# Patient Record
Sex: Male | Born: 1970 | Race: Black or African American | Hispanic: No | Marital: Single | Smoking: Current every day smoker
Health system: Southern US, Community
[De-identification: ages and names within clinical notes are randomized; demographics above are authoritative.]

---

## 2010-07-14 ENCOUNTER — Emergency Department (HOSPITAL_BASED_OUTPATIENT_CLINIC_OR_DEPARTMENT_OTHER)
Admission: EM | Admit: 2010-07-14 | Discharge: 2010-07-14 | Disposition: A | Discharge status: Home / Self Care | Payer: Self-pay | Attending: Emergency Medicine | Admitting: Emergency Medicine

## 2010-07-14 DIAGNOSIS — R05 Cough: Secondary | ICD-10-CM | POA: Insufficient documentation

## 2010-07-14 DIAGNOSIS — B9789 Other viral agents as the cause of diseases classified elsewhere: Secondary | ICD-10-CM | POA: Insufficient documentation

## 2010-07-14 DIAGNOSIS — R070 Pain in throat: Secondary | ICD-10-CM | POA: Insufficient documentation

## 2010-07-14 DIAGNOSIS — IMO0001 Reserved for inherently not codable concepts without codable children: Secondary | ICD-10-CM | POA: Insufficient documentation

## 2010-07-14 DIAGNOSIS — F17200 Nicotine dependence, unspecified, uncomplicated: Secondary | ICD-10-CM | POA: Insufficient documentation

## 2010-07-14 DIAGNOSIS — R059 Cough, unspecified: Secondary | ICD-10-CM | POA: Insufficient documentation

## 2015-04-09 ENCOUNTER — Emergency Department (HOSPITAL_COMMUNITY)
Admission: EM | Admit: 2015-04-09 | Discharge: 2015-04-09 | Disposition: A | Discharge status: Home / Self Care | Payer: Self-pay | Attending: Emergency Medicine | Admitting: Emergency Medicine

## 2015-04-09 ENCOUNTER — Encounter (HOSPITAL_COMMUNITY): Payer: Self-pay

## 2015-04-09 DIAGNOSIS — K029 Dental caries, unspecified: Secondary | ICD-10-CM | POA: Insufficient documentation

## 2015-04-09 DIAGNOSIS — F17200 Nicotine dependence, unspecified, uncomplicated: Secondary | ICD-10-CM | POA: Insufficient documentation

## 2015-04-09 DIAGNOSIS — K0889 Other specified disorders of teeth and supporting structures: Secondary | ICD-10-CM | POA: Insufficient documentation

## 2015-04-09 DIAGNOSIS — H6123 Impacted cerumen, bilateral: Secondary | ICD-10-CM | POA: Insufficient documentation

## 2015-04-09 MED ORDER — NAPROXEN 500 MG PO TABS: 500.0000 mg | ORAL_TABLET | Freq: Two times a day (BID) | ORAL | Status: DC

## 2015-04-09 MED ORDER — AMOXICILLIN 500 MG PO CAPS: 500.0000 mg | ORAL_CAPSULE | Freq: Three times a day (TID) | ORAL | Status: DC

## 2015-04-09 MED ORDER — NEOMYCIN-COLIST-HC-THONZONIUM 3.3-3-10-0.5 MG/ML OT SUSP
4.0000 [drp] | Freq: Four times a day (QID) | OTIC | Status: DC
  Filled 2015-04-09: qty 5

## 2015-04-09 MED ORDER — HYDROCODONE-ACETAMINOPHEN 5-325 MG PO TABS
1.0000 | ORAL_TABLET | Freq: Once | ORAL | Status: AC
  Administered 2015-04-09: 1 via ORAL
  Filled 2015-04-09: qty 1

## 2015-04-09 MED ORDER — NEOMYCIN-POLYMYXIN-HC 3.5-10000-1 OT SUSP
4.0000 [drp] | Freq: Four times a day (QID) | OTIC | Status: DC
  Administered 2015-04-09: 4 [drp] via OTIC
  Filled 2015-04-09: qty 10

## 2015-04-09 NOTE — ED Notes (Signed)
Pt states "I am unable to hear." Has been going on for 2 months. Hurts when he lefts of his left side.

## 2015-04-09 NOTE — ED Provider Notes (Signed)
CSN: [id number]     Arrival date & time 04/09/15  1825 History  By signing my name below, I, Dillon Schaefer, attest that this documentation has been prepared under the direction and in the presence of Dillon BuffaloHope Sharmane Dame, NP. Electronically Signed: Evon Slackerrance Schaefer, ED Scribe. 04/09/2015. 8:32 PM.     Chief Complaint  Patient presents with  . Otalgia  . Hearing Problem    Patient is a 44 y.o. male presenting with ear pain. The history is provided by the patient. No language interpreter was used.  Otalgia Location:  Bilateral Behind ear:  No abnormality Severity:  Moderate Onset quality:  Gradual Duration:  2 months Timing:  Constant Chronicity:  New Relieved by:  None tried Worsened by:  Nothing tried Ineffective treatments:  None tried Associated symptoms: hearing loss   Associated symptoms: no ear discharge    HPI Comments: Dillon Schaefer is a 44 y.o. male who presents to the Emergency Department complaining of ear pain onset 2 months. Pt states that he has associated decreased hearing. Pt states that he his ears feel clogged. Pt also report left lower tooth pain.   History reviewed. No pertinent past medical history. History reviewed. No pertinent past surgical history. History reviewed. No pertinent family history. Social History  Substance Use Topics  . Smoking status: Current Every Day Smoker -- 0.50 packs/day  . Smokeless tobacco: None  . Alcohol Use: Yes    Review of Systems  HENT: Positive for dental problem, ear pain and hearing loss. Negative for ear discharge.   All other systems reviewed and are negative.    Allergies  Review of patient's allergies indicates not on file.  Home Medications   Prior to Admission medications   Medication Sig Start Date End Date Taking? Authorizing Provider  amoxicillin (AMOXIL) 500 MG capsule Take 1 capsule (500 mg total) by mouth 3 (three) times daily. 04/09/15   Dillon Wilbert Orlene OchM [place] Cordts, NP  naproxen (NAPROSYN) 500 MG tablet Take 1 tablet  (500 mg total) by mouth 2 (two) times daily. 04/09/15   Dillon Coste Orlene OchM Lurine Imel, NP   BP 124/83 mmHg  Pulse 107  Temp(Src) 98.2 F (36.8 C) (Oral)  Resp 20  SpO2 100%   Physical Exam  Constitutional: He is oriented to person, place, and time. He appears well-developed and well-nourished. No distress.  HENT:  Head: Normocephalic and atraumatic.  1st and 2nd molar decayed to gum line with swelling and erythema around tooth. TM's occluded by cerumen  Eyes: Conjunctivae and EOM are normal.  Neck: Neck supple. No tracheal deviation present.  Cardiovascular: Normal rate, regular rhythm and normal heart sounds.   Pulmonary/Chest: Effort normal and breath sounds normal. No respiratory distress.  Musculoskeletal: Normal range of motion.  Neurological: He is alert and oriented to person, place, and time.  Skin: Skin is warm and dry.  Psychiatric: He has a normal mood and affect. His behavior is normal.  Nursing note and vitals reviewed.   ED Course  Procedures (including critical care time)  Ear irrigation with good results, large pieces of cerumen removed with irrigation DIAGNOSTIC STUDIES: Oxygen Saturation is 100% on RA, normal by my interpretation.    COORDINATION OF CARE: 7:26 PM-Discussed treatment plan with pt at bedside and pt agreed to plan.      MDM  44 y.o. male with decreased hearing and pressure feeling in ear x 2 weeks and dental pain for the past few days. Stable for d/c without fever and does not appear toxic. Cortisporin Otic  suspension with first dose instilled into both ear canals prior to d/c. Will treat with Amoxicillin for dental infection and NSAIDS for pain. Dental referral given.   Final diagnoses:  Cerumen impaction, bilateral  Pain due to dental caries    I personally performed the services described in this documentation, which was scribed in my presence. The recorded information has been reviewed and is accurate.      [place], [place] 04/09/15 2033  Dillon Guise, MD 04/10/15 Dillon Schaefer

## 2015-04-09 NOTE — Discharge Instructions (Signed)
Call the dentis tomorrow for a follow up appointment.  Use the ear drops 4 drops in each ear 3 or 4 times a day for the next week.  Dental Caries Dental caries is tooth decay. This decay can cause a hole in teeth (cavity) that can get bigger and deeper over time. HOME CARE  Brush and floss your teeth. Do this at least two times a day.  Use a fluoride toothpaste.  Use a mouth rinse if told by your dentist or doctor.  Eat less sugary and starchy foods. Drink less sugary drinks.  Avoid snacking often on sugary and starchy foods. Avoid sipping often on sugary drinks.  Keep regular checkups and cleanings with your dentist.  Use fluoride supplements if told by your dentist or doctor.  Allow fluoride to be applied to teeth if told by your dentist or doctor.   This information is not intended to replace advice given to you by your health care provider. Make sure you discuss any questions you have with your health care provider.   Document Released: 02/06/2008 Document Revised: 05/20/2014 Document Reviewed: 05/01/2012 Elsevier Interactive Patient Education [employer]2016 [employer] Inc.

## 2018-07-22 ENCOUNTER — Emergency Department (HOSPITAL_COMMUNITY): Payer: Self-pay

## 2018-07-22 ENCOUNTER — Other Ambulatory Visit: Payer: Self-pay

## 2018-07-22 ENCOUNTER — Inpatient Hospital Stay (HOSPITAL_COMMUNITY)
Admission: EM | Admit: 2018-07-22 | Discharge: 2018-07-28 | DRG: 377 | Disposition: A | Discharge status: Home / Self Care | Payer: Self-pay | Attending: Internal Medicine | Admitting: Internal Medicine

## 2018-07-22 ENCOUNTER — Encounter (HOSPITAL_COMMUNITY): Payer: Self-pay

## 2018-07-22 DIAGNOSIS — Z79899 Other long term (current) drug therapy: Secondary | ICD-10-CM

## 2018-07-22 DIAGNOSIS — R9389 Abnormal findings on diagnostic imaging of other specified body structures: Secondary | ICD-10-CM | POA: Diagnosis present

## 2018-07-22 DIAGNOSIS — E872 Acidosis: Secondary | ICD-10-CM | POA: Diagnosis present

## 2018-07-22 DIAGNOSIS — M7989 Other specified soft tissue disorders: Secondary | ICD-10-CM | POA: Diagnosis present

## 2018-07-22 DIAGNOSIS — M154 Erosive (osteo)arthritis: Secondary | ICD-10-CM | POA: Diagnosis present

## 2018-07-22 DIAGNOSIS — Z23 Encounter for immunization: Secondary | ICD-10-CM

## 2018-07-22 DIAGNOSIS — F17210 Nicotine dependence, cigarettes, uncomplicated: Secondary | ICD-10-CM | POA: Diagnosis present

## 2018-07-22 DIAGNOSIS — Z791 Long term (current) use of non-steroidal anti-inflammatories (NSAID): Secondary | ICD-10-CM

## 2018-07-22 DIAGNOSIS — F1010 Alcohol abuse, uncomplicated: Secondary | ICD-10-CM | POA: Diagnosis present

## 2018-07-22 DIAGNOSIS — K5909 Other constipation: Secondary | ICD-10-CM | POA: Diagnosis present

## 2018-07-22 DIAGNOSIS — L03116 Cellulitis of left lower limb: Secondary | ICD-10-CM | POA: Diagnosis present

## 2018-07-22 DIAGNOSIS — M50322 Other cervical disc degeneration at C5-C6 level: Secondary | ICD-10-CM | POA: Diagnosis present

## 2018-07-22 DIAGNOSIS — Z9181 History of falling: Secondary | ICD-10-CM

## 2018-07-22 DIAGNOSIS — D62 Acute posthemorrhagic anemia: Secondary | ICD-10-CM | POA: Diagnosis present

## 2018-07-22 DIAGNOSIS — E440 Moderate protein-calorie malnutrition: Secondary | ICD-10-CM

## 2018-07-22 DIAGNOSIS — R634 Abnormal weight loss: Secondary | ICD-10-CM | POA: Diagnosis present

## 2018-07-22 DIAGNOSIS — J439 Emphysema, unspecified: Secondary | ICD-10-CM | POA: Diagnosis present

## 2018-07-22 DIAGNOSIS — E871 Hypo-osmolality and hyponatremia: Secondary | ICD-10-CM | POA: Diagnosis present

## 2018-07-22 DIAGNOSIS — K259 Gastric ulcer, unspecified as acute or chronic, without hemorrhage or perforation: Secondary | ICD-10-CM

## 2018-07-22 DIAGNOSIS — K760 Fatty (change of) liver, not elsewhere classified: Secondary | ICD-10-CM | POA: Diagnosis present

## 2018-07-22 DIAGNOSIS — K2100 Gastro-esophageal reflux disease with esophagitis, without bleeding: Secondary | ICD-10-CM

## 2018-07-22 DIAGNOSIS — K2970 Gastritis, unspecified, without bleeding: Secondary | ICD-10-CM

## 2018-07-22 DIAGNOSIS — F5089 Other specified eating disorder: Secondary | ICD-10-CM | POA: Diagnosis present

## 2018-07-22 DIAGNOSIS — F1290 Cannabis use, unspecified, uncomplicated: Secondary | ICD-10-CM | POA: Diagnosis present

## 2018-07-22 DIAGNOSIS — W19XXXA Unspecified fall, initial encounter: Secondary | ICD-10-CM

## 2018-07-22 DIAGNOSIS — K3189 Other diseases of stomach and duodenum: Secondary | ICD-10-CM | POA: Diagnosis present

## 2018-07-22 DIAGNOSIS — K2981 Duodenitis with bleeding: Principal | ICD-10-CM | POA: Diagnosis present

## 2018-07-22 DIAGNOSIS — D509 Iron deficiency anemia, unspecified: Secondary | ICD-10-CM

## 2018-07-22 DIAGNOSIS — K921 Melena: Secondary | ICD-10-CM | POA: Diagnosis present

## 2018-07-22 DIAGNOSIS — D649 Anemia, unspecified: Secondary | ICD-10-CM | POA: Diagnosis present

## 2018-07-22 DIAGNOSIS — R6510 Systemic inflammatory response syndrome (SIRS) of non-infectious origin without acute organ dysfunction: Secondary | ICD-10-CM | POA: Diagnosis present

## 2018-07-22 DIAGNOSIS — K2990 Gastroduodenitis, unspecified, without bleeding: Secondary | ICD-10-CM | POA: Diagnosis present

## 2018-07-22 DIAGNOSIS — Z833 Family history of diabetes mellitus: Secondary | ICD-10-CM

## 2018-07-22 DIAGNOSIS — M109 Gout, unspecified: Secondary | ICD-10-CM | POA: Diagnosis present

## 2018-07-22 DIAGNOSIS — E43 Unspecified severe protein-calorie malnutrition: Secondary | ICD-10-CM | POA: Diagnosis present

## 2018-07-22 DIAGNOSIS — R61 Generalized hyperhidrosis: Secondary | ICD-10-CM | POA: Diagnosis present

## 2018-07-22 DIAGNOSIS — Y92009 Unspecified place in unspecified non-institutional (private) residence as the place of occurrence of the external cause: Secondary | ICD-10-CM

## 2018-07-22 DIAGNOSIS — M009 Pyogenic arthritis, unspecified: Secondary | ICD-10-CM | POA: Diagnosis present

## 2018-07-22 DIAGNOSIS — E860 Dehydration: Secondary | ICD-10-CM | POA: Diagnosis present

## 2018-07-22 DIAGNOSIS — M791 Myalgia, unspecified site: Secondary | ICD-10-CM

## 2018-07-22 DIAGNOSIS — K254 Chronic or unspecified gastric ulcer with hemorrhage: Secondary | ICD-10-CM | POA: Diagnosis present

## 2018-07-22 DIAGNOSIS — M25512 Pain in left shoulder: Secondary | ICD-10-CM | POA: Diagnosis present

## 2018-07-22 DIAGNOSIS — K922 Gastrointestinal hemorrhage, unspecified: Secondary | ICD-10-CM | POA: Diagnosis present

## 2018-07-22 LAB — URINALYSIS, ROUTINE W REFLEX MICROSCOPIC
BACTERIA UA: NONE SEEN
Bilirubin Urine: NEGATIVE
GLUCOSE, UA: NEGATIVE mg/dL
Hgb urine dipstick: NEGATIVE
KETONES UR: 5 mg/dL — AB
Leukocytes,Ua: NEGATIVE
NITRITE: NEGATIVE
PROTEIN: 100 mg/dL — AB
Specific Gravity, Urine: 1.032 — ABNORMAL HIGH (ref 1.005–1.030)
pH: 5 (ref 5.0–8.0)

## 2018-07-22 LAB — CBC WITH DIFFERENTIAL/PLATELET
Abs Immature Granulocytes: 0.44 10*3/uL — ABNORMAL HIGH (ref 0.00–0.07)
BASOS PCT: 0 %
Basophils Absolute: 0.1 10*3/uL (ref 0.0–0.1)
Eosinophils Absolute: 0 10*3/uL (ref 0.0–0.5)
Eosinophils Relative: 0 %
HEMATOCRIT: 26.3 % — AB (ref 39.0–52.0)
HEMOGLOBIN: 7.3 g/dL — AB (ref 13.0–17.0)
Immature Granulocytes: 2 %
LYMPHS PCT: 4 %
Lymphs Abs: 1 10*3/uL (ref 0.7–4.0)
MCH: 18.9 pg — AB (ref 26.0–34.0)
MCHC: 27.8 g/dL — AB (ref 30.0–36.0)
MCV: 68.1 fL — ABNORMAL LOW (ref 80.0–100.0)
MONOS PCT: 10 %
Monocytes Absolute: 2.8 10*3/uL — ABNORMAL HIGH (ref 0.1–1.0)
NEUTROS ABS: 24.4 10*3/uL — AB (ref 1.7–7.7)
NEUTROS PCT: 84 %
Platelets: 259 10*3/uL (ref 150–400)
RBC: 3.86 MIL/uL — AB (ref 4.22–5.81)
RDW: 21.8 % — ABNORMAL HIGH (ref 11.5–15.5)
WBC: 28.7 10*3/uL — ABNORMAL HIGH (ref 4.0–10.5)
nRBC: 0.1 % (ref 0.0–0.2)

## 2018-07-22 LAB — OCCULT BLOOD, POC DEVICE: Fecal Occult Bld: POSITIVE — AB

## 2018-07-22 LAB — COMPREHENSIVE METABOLIC PANEL
ALBUMIN: 2.9 g/dL — AB (ref 3.5–5.0)
ALT: 16 U/L (ref 0–44)
ANION GAP: 11 (ref 5–15)
AST: 17 U/L (ref 15–41)
Alkaline Phosphatase: 63 U/L (ref 38–126)
BILIRUBIN TOTAL: 0.5 mg/dL (ref 0.3–1.2)
BUN: 18 mg/dL (ref 6–20)
CHLORIDE: 96 mmol/L — AB (ref 98–111)
CO2: 24 mmol/L (ref 22–32)
Calcium: 8.7 mg/dL — ABNORMAL LOW (ref 8.9–10.3)
Creatinine, Ser: 0.73 mg/dL (ref 0.61–1.24)
GFR calc Af Amer: >60 mL/min (ref >60)
GFR calc non Af Amer: >60 mL/min (ref >60)
GLUCOSE: 116 mg/dL — AB (ref 70–99)
POTASSIUM: 4 mmol/L (ref 3.5–5.1)
SODIUM: 131 mmol/L — AB (ref 135–145)
TOTAL PROTEIN: 6.9 g/dL (ref 6.5–8.1)

## 2018-07-22 LAB — LACTIC ACID, PLASMA: LACTIC ACID, VENOUS: 1.5 mmol/L (ref 0.5–1.9)

## 2018-07-22 MED ORDER — SODIUM CHLORIDE 0.9 % IV BOLUS
1000.0000 mL | Freq: Once | INTRAVENOUS | Status: AC
  Administered 2018-07-23: 1000 mL via INTRAVENOUS

## 2018-07-22 MED ORDER — MORPHINE SULFATE (PF) 4 MG/ML IV SOLN
4.0000 mg | Freq: Once | INTRAVENOUS | Status: AC
  Administered 2018-07-22: 4 mg via INTRAVENOUS
  Filled 2018-07-22: qty 1

## 2018-07-22 MED ORDER — PANTOPRAZOLE SODIUM 40 MG IV SOLR
40.0000 mg | Freq: Once | INTRAVENOUS | Status: AC
  Administered 2018-07-22: 40 mg via INTRAVENOUS
  Filled 2018-07-22: qty 40

## 2018-07-22 MED ORDER — LORAZEPAM 2 MG/ML IJ SOLN
1.0000 mg | Freq: Once | INTRAMUSCULAR | Status: AC
  Administered 2018-07-22: 1 mg via INTRAVENOUS
  Filled 2018-07-22: qty 1

## 2018-07-22 MED ORDER — THIAMINE HCL 100 MG/ML IJ SOLN
Freq: Once | INTRAVENOUS | Status: AC
  Administered 2018-07-22: 21:00:00 via INTRAVENOUS
  Filled 2018-07-22: qty 1000

## 2018-07-22 MED ORDER — SODIUM CHLORIDE 0.9% FLUSH: 3.0000 mL | Freq: Once | INTRAVENOUS | Status: DC

## 2018-07-22 NOTE — ED Notes (Signed)
Darden Dates friend contact information [phone]

## 2018-07-22 NOTE — ED Triage Notes (Signed)
Pt BIBA from home. Pt states 4-5 days of pain across neck and shoulders bilaterally. Pt states he has had hot and cold chills at home. Pt states he is now having generalized body aches and weakness.  Pt states he has been taking ibuprofen and acetaminophen without relief. Pt has had within 4 hours.  Pt also states that he fell several days ago, injuring right arm with some swelling. No LOC, didn't hit head.

## 2018-07-22 NOTE — ED Provider Notes (Signed)
[facility]-EMERGENCY DEPT Provider Note   CSN: [id number] Arrival date & time: 07/22/18  1511    History   Chief Complaint Chief Complaint  Patient presents with   Generalized Body Aches   Arm Injury    right    HPI Dillon Schaefer is a 48 y.o. male.     48 year old male with prior medical history as below presents for evaluation of myalgia and fatigue. Patient reports onset of symptoms over the last 3-4 days. He reports fall at home 2 days ago with resulting contusion/pain to right hand and left foot. He also complains of weakness. He complains of pain across his back which is worse with movement. He denies chest pain or abdominal pain. He reports drinking ETOH regularly - his last drink was earlier today.  He also reports dark tarry stool - this appears to be intermittent over the last few weeks.   He denies fever.  Chart review does reveal prior history of ETOH with associated GI Bleed requiring transfusion.   The history is provided by the patient and medical records.  Illness  Location:  Myalgia, fatigue, weakness.  Severity:  Moderate Onset quality:  Gradual Duration:  4 days Timing:  Constant Progression:  Waxing and waning Chronicity:  New Associated symptoms: fatigue   Associated symptoms: no abdominal pain, no chest pain, no fever and no vomiting     History reviewed. No pertinent past medical history.  There are no active problems to display for this patient.   History reviewed. No pertinent surgical history.      Home Medications    Prior to Admission medications   Medication Sig Start Date End Date Taking? Authorizing Provider  acetaminophen (TYLENOL) 325 MG tablet Take 650 mg by mouth every 4 (four) hours as needed for moderate pain or headache.   Yes [provider]  ibuprofen (ADVIL,MOTRIN) 200 MG tablet Take 400 mg by mouth every 6 (six) hours as needed for headache or moderate pain.   Yes [provider]   amoxicillin (AMOXIL) 500 MG capsule Take 1 capsule (500 mg total) by mouth 3 (three) times daily. Patient not taking: Reported on 07/22/2018 04/09/15   Janne Napoleon, NP  naproxen (NAPROSYN) 500 MG tablet Take 1 tablet (500 mg total) by mouth 2 (two) times daily. Patient not taking: Reported on 07/22/2018 04/09/15   Janne Napoleon, NP    Family History No family history on file.  Social History Social History   Tobacco Use   Smoking status: Current Every Day Smoker    Packs/day: 0.50  Substance Use Topics   Alcohol use: Yes   Drug use: Yes    Frequency: 7.0 times per week    Types: Marijuana     Allergies   Patient has no known allergies.   Review of Systems Review of Systems  Constitutional: Positive for fatigue. Negative for fever.  Cardiovascular: Negative for chest pain.  Gastrointestinal: Negative for abdominal pain and vomiting.  All other systems reviewed and are negative.    Physical Exam Updated Vital Signs BP 119/82 (BP Location: Left Arm)    Pulse 96    Temp 99.2 F (37.3 C) (Oral)    Resp 16    Ht  (1.854 m)    Wt 71.2 kg    SpO2 99%    BMI 20.71 kg/m   Physical Exam Vitals signs and nursing note reviewed.  Constitutional:      General: He is  not in acute distress.    Appearance: Normal appearance. He is well-developed.  HENT:     Head: Normocephalic and atraumatic.  Eyes:     Conjunctiva/sclera: Conjunctivae normal.     Pupils: Pupils are equal, round, and reactive to light.  Neck:     Musculoskeletal: Normal range of motion and neck supple.  Cardiovascular:     Rate and Rhythm: Normal rate and regular rhythm.     Heart sounds: Normal heart sounds.  Pulmonary:     Effort: Pulmonary effort is normal. No respiratory distress.     Breath sounds: Normal breath sounds.  Abdominal:     General: There is no distension.     Palpations: Abdomen is soft.     Tenderness: There is no abdominal tenderness.  Musculoskeletal: Normal range of  motion.        General: Swelling and tenderness present. No deformity.     Comments: Moderate tenderness and erythema to right hand (dorsum) with mild edema   Moderate tenderness to left foot (plantar aspect) with mild edema    Skin:    General: Skin is warm and dry.  Neurological:     General: No focal deficit present.     Mental Status: He is alert and oriented to person, place, and time. Mental status is at baseline.      ED Treatments / Results  Labs (all labs ordered are listed, but only abnormal results are displayed) Labs Reviewed  COMPREHENSIVE METABOLIC PANEL - Abnormal; Notable for the following components:      Result Value   Sodium 131 (*)    Chloride 96 (*)    Glucose, Bld 116 (*)    Calcium 8.7 (*)    Albumin 2.9 (*)    All other components within normal limits  CBC WITH DIFFERENTIAL/PLATELET - Abnormal; Notable for the following components:   WBC 28.7 (*)    RBC 3.86 (*)    Hemoglobin 7.3 (*)    HCT 26.3 (*)    MCV 68.1 (*)    MCH 18.9 (*)    MCHC 27.8 (*)    RDW 21.8 (*)    Neutro Abs 24.4 (*)    Monocytes Absolute 2.8 (*)    Abs Immature Granulocytes 0.44 (*)    All other components within normal limits  URINALYSIS, ROUTINE W REFLEX MICROSCOPIC - Abnormal; Notable for the following components:   APPearance HAZY (*)    Specific Gravity, Urine 1.032 (*)    Ketones, ur 5 (*)    Protein, ur 100 (*)    All other components within normal limits  OCCULT BLOOD, POC DEVICE - Abnormal; Notable for the following components:   Fecal Occult Bld POSITIVE (*)    All other components within normal limits  LACTIC ACID, PLASMA  LACTIC ACID, PLASMA  ETHANOL  INFLUENZA PANEL BY PCR (TYPE A & B)  CK  POC OCCULT BLOOD, ED  POC OCCULT BLOOD, ED  TYPE AND SCREEN    EKG None  Radiology Dg Chest 2 View  Result Date: 07/22/2018 CLINICAL DATA:  48 year old male with flu like symptoms. Body aches and pain. Smoker. EXAM: CHEST - 2 VIEW COMPARISON:  None. FINDINGS:  Lung volumes at the upper limits of normal. Mediastinal contours are within normal limits. Visualized tracheal air column is within normal limits. No pneumothorax, pleural effusion or confluent pulmonary opacity. Mild diffuse increased pulmonary interstitial markings. No acute osseous abnormality identified. Negative visible bowel gas pattern. IMPRESSION: Mild diffuse increased pulmonary  interstitial markings which could be smoking related, but consider viral/atypical respiratory infection in this clinical setting. No pleural effusion. Electronically Signed   By: Odessa Fleming M.D.   On: 07/22/2018 16:46   Dg Wrist Complete Right  Result Date: 07/22/2018 CLINICAL DATA:  48 year old male with flu like symptoms. Body aches. Pain. Fall several days ago with right upper extremity pain and swelling. EXAM: RIGHT WRIST - COMPLETE 3+ VIEW COMPARISON:  None. FINDINGS: Bone mineralization is within normal limits. Generalized soft tissue swelling. Distal radius and ulna are intact. Carpal bones appear intact and normally aligned. Chronic appearing degenerative spurring and mild fragmentation at the base of the 5th metacarpal. No acute fracture identified. IMPRESSION: Chronic appearing changes at the base of the 5th metacarpal. No acute fracture or dislocation identified. Electronically Signed   By: Odessa Fleming M.D.   On: 07/22/2018 16:48   Ct Head Wo Contrast  Result Date: 07/22/2018 CLINICAL DATA:  Pain across the neck and shoulders bilaterally. Hot and cold chills. EXAM: CT HEAD WITHOUT CONTRAST CT CERVICAL SPINE WITHOUT CONTRAST TECHNIQUE: Multidetector CT imaging of the head and cervical spine was performed following the standard protocol without intravenous contrast. Multiplanar CT image reconstructions of the cervical spine were also generated. COMPARISON:  None. FINDINGS: CT HEAD FINDINGS Brain: No evidence of acute infarction, hemorrhage, hydrocephalus, extra-axial collection or mass lesion/mass effect. Vascular: No  hyperdense vessel or unexpected calcification. Skull: Normal. Negative for fracture or focal lesion. Sinuses/Orbits: No acute finding. Other: None. CT CERVICAL SPINE FINDINGS Alignment: Slight reversal cervical lordosis at C5-6 attributable to degenerative disc disease. Skull base and vertebrae: Intact skull base. Bone island noted of the C4 vertebral body on the left. No acute cervical spine fracture. Uncovertebral joint erosive osteoarthritis with subchondral erosive changes noted at C5-6 on the right. Jumped or perched facets. Soft tissues and spinal canal: No prevertebral fluid or swelling. No visible canal hematoma. Disc levels: Moderate disc flattening C5-6 with small posterior marginal osteophytes. No significant foraminal encroachment. Upper chest: Negative. Other: None IMPRESSION: 1. No acute intracranial abnormality. 2. 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 No acute cervical spine fracture. Electronically Signed   By: Tollie Eth M.D.   On: 07/22/2018 20:14   Ct Cervical Spine Wo Contrast  Result Date: 07/22/2018 CLINICAL DATA:  Pain across the neck and shoulders bilaterally. Hot and cold chills. EXAM: CT HEAD WITHOUT CONTRAST CT CERVICAL SPINE WITHOUT CONTRAST TECHNIQUE: Multidetector CT imaging of the head and cervical spine was performed following the standard protocol without intravenous contrast. Multiplanar CT image reconstructions of the cervical spine were also generated. COMPARISON:  None. FINDINGS: CT HEAD FINDINGS Brain: No evidence of acute infarction, hemorrhage, hydrocephalus, extra-axial collection or mass lesion/mass effect. Vascular: No hyperdense vessel or unexpected calcification. Skull: Normal. Negative for fracture or focal lesion. Sinuses/Orbits: No acute finding. Other: None. CT CERVICAL SPINE FINDINGS Alignment: Slight reversal cervical lordosis at  C5-6 attributable to degenerative disc disease. Skull base and vertebrae: Intact skull base. Bone island noted of the C4 vertebral body on the left. No acute cervical spine fracture. Uncovertebral joint erosive osteoarthritis with subchondral erosive changes noted at C5-6 on the right. Jumped or perched facets. Soft tissues and spinal canal: No prevertebral fluid or swelling. No visible canal hematoma. Disc levels: Moderate disc flattening C5-6 with small posterior marginal osteophytes. No significant foraminal encroachment. Upper chest: Negative. Other: None IMPRESSION: 1. No acute intracranial abnormality.  2. 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 No acute cervical spine fracture. Electronically Signed   By: Tollie Eth M.D.   On: 07/22/2018 20:14   Dg Hand Complete Right  Result Date: 07/22/2018 CLINICAL DATA:  Initial evaluation for acute general right hand pain with swelling. EXAM: RIGHT HAND - COMPLETE 3+ VIEW COMPARISON:  None. FINDINGS: No acute fracture or dislocation. Chronic changes at the base of the right fifth metacarpal. Joint spaces otherwise maintained without evidence for significant degenerative or erosive arthropathy. No discrete osseous lesions. No soft tissue abnormality. IMPRESSION: 1. No acute osseous abnormality. 2. Chronic changes at the base of the right fifth metacarpal. Electronically Signed   By: Rise Mu M.D.   On: 07/22/2018 20:04   Dg Foot Complete Left  Result Date: 07/22/2018 CLINICAL DATA:  Initial evaluation for acute generalized left foot pain with swelling. EXAM: LEFT FOOT - COMPLETE 3+ VIEW COMPARISON:  None. FINDINGS: Chronic changes at the medial tarsometatarsal articulations. Joint spaces otherwise maintained without evidence for significant degenerative or erosive arthropathy. No acute fracture dislocation. No discrete osseous  lesions. No soft tissue abnormality. IMPRESSION: 1. No acute osseous abnormality. 2. Chronic changes at the medial tarsometatarsal articulations. Electronically Signed   By: Rise Mu M.D.   On: 07/22/2018 20:07    Procedures Procedures (including critical care time)  Medications Ordered in ED Medications  sodium chloride flush (NS) 0.9 % injection 3 mL (has no administration in time range)  pantoprazole (PROTONIX) injection 40 mg (has no administration in time range)  morphine 4 MG/ML injection 4 mg (has no administration in time range)  LORazepam (ATIVAN) injection 1 mg (has no administration in time range)  sodium chloride 0.9 % 1,000 mL with thiamine 100 mg, folic acid 1 mg, multivitamins adult 10 mL infusion ( Intravenous New Bag/Given 07/22/18 2039)     Initial Impression / Assessment and Plan / ED Course  I have reviewed the triage vital signs and the nursing notes.  Pertinent labs & imaging results that were available during my care of the patient were reviewed by me and considered in my medical decision making (see chart for details).        MDM  Screen complete  Presenting for evaluation of fatigue and weakness.  Patient with prior history significant for alcohol use and abuse.  Patient found to be anemic with hemoglobin 7.3.  Patient's stool guaiac is positive.  Bun is noted to be normal.   His painful right hand and left foot may be secondary to mild trauma or gouty flare.   Patient would benefit from further inpatient workup and treatment.   Hospitalist service [facility]) is aware of case and will evaluate for admission.    Final Clinical Impressions(s) / ED Diagnoses   Final diagnoses:  Anemia, unspecified type  Gastrointestinal hemorrhage, unspecified gastrointestinal hemorrhage type  Myalgia    ED Discharge Orders    None       Wynetta Fines, MD 07/22/18 2339

## 2018-07-22 NOTE — H&P (Signed)
Dillon Schaefer ZOX:[id number] DOB: [birth year] DOA: 07/22/2018     PCP: Patient, No Pcp Per   Outpatient Specialists:  NONE    Patient arrived to ER on 07/22/18 at 1511  Patient coming from: boarding house  Chief Complaint:  Chief Complaint  Patient presents with   Generalized Body Aches   Arm Injury    right    HPI: Dillon Schaefer is a 48 y.o. male with medical history significant of  Duodenitis, EtOh abuse    Presented with   4 to 5-day history of pain across neck and shoulders with hot and cold chills at home with generalized body aches Taking ibuprofen and acetaminophen he had a fall few days ago injured his right did not hit his head.  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 lives in crowded conditions with many other people few have been ill With frequent people coming and going from all over.  He originally from [place] but have been living in [place] for 17 years  He has hx of PICA he had been eating soap in the past  Stopped few months ago. Reports he lost some weight over few years   Regarding pertinent Chronic problems: hx of blood transfusion and duodenitis admitted to [facility] in 2013   While in ER:  The following Work up has been ordered so far:  Orders Placed This Encounter  Procedures   DG Chest 2 View   DG Wrist Complete Right   DG Hand Complete Right   DG Foot Complete Left   CT Head Wo Contrast   CT Cervical Spine Wo  Contrast   Lactic acid, plasma   Comprehensive metabolic panel   CBC with Differential   Urinalysis, Routine w reflex microscopic   Ethanol   Influenza panel by PCR (type A & B)   CK   Diet NPO time specified   Saline Lock IV, Maintain IV access   Apply ice to affected area (if injury is <48 hours old)   Immobilize affected extremity   Remove jewelry   Consult to hospitalist   Droplet precaution   POC occult blood, ED   POC occult blood, ED   Occult blood, poc device   Type and screen [facility]     Following Medications were ordered in ER: Medications  sodium chloride flush (NS) 0.9 % injection 3 mL (has no administration in time range)  pantoprazole (PROTONIX) injection 40 mg (has no administration in time range)  morphine 4 MG/ML injection 4 mg (has no administration in time range)  LORazepam (ATIVAN) injection 1 mg (has no administration in time range)  sodium chloride 0.9 % 1,000 mL with thiamine 100 mg, folic acid 1 mg, multivitamins adult 10 mL infusion ( Intravenous New Bag/Given 07/22/18 2039)    Significant initial  Findings: Abnormal Labs Reviewed  COMPREHENSIVE METABOLIC PANEL - Abnormal;  Notable for the following components:      Result Value   Sodium 131 (*)    Chloride 96 (*)    Glucose, Bld 116 (*)    Calcium 8.7 (*)    Albumin 2.9 (*)    All other components within normal limits  CBC WITH DIFFERENTIAL/PLATELET - Abnormal; Notable for the following components:   WBC 28.7 (*)    RBC 3.86 (*)    Hemoglobin 7.3 (*)    HCT 26.3 (*)    MCV 68.1 (*)    MCH 18.9 (*)    MCHC 27.8 (*)    RDW 21.8 (*)    Neutro Abs 24.4 (*)    Monocytes Absolute 2.8 (*)    Abs Immature Granulocytes 0.44 (*)    All other components within normal limits  URINALYSIS, ROUTINE W REFLEX MICROSCOPIC - Abnormal; Notable for the following components:   APPearance HAZY (*)    Specific Gravity, Urine 1.032 (*)    Ketones, ur 5 (*)    Protein, ur  100 (*)    All other components within normal limits  OCCULT BLOOD, POC DEVICE - Abnormal; Notable for the following components:   Fecal Occult Bld POSITIVE (*)    All other components within normal limits     Lactic Acid, Venous    Component Value Date/Time   LATICACIDVEN 1.5 07/22/2018 1526   Lactic acid 1.5   Na 131 K 4.0 Alb 2.9  Cr    stable,    Lab Results  Component Value Date   CREATININE 0.73 07/22/2018    HemOccult positive  WBC 28.7   HG/HCT     Component Value Date/Time   HGB 7.3 (L) 07/22/2018 1526   HCT 26.3 (L) 07/22/2018 1526     Troponin (Point of Care Test) No results for input(s): TROPIPOC in the last 72 hours.  PLT 259     UA  no evidence of UTI       CT neck non acute CXR - mild diffuse pulmonary interstitial markings Films right hand neg Film right foot neg  CT chest - emphesema    ECG:  Personally reviewed by me showing: HR : 106 Rhythm Sinus tachycardia     no evidence of ischemic changes QTC 490      ED Triage Vitals  Enc Vitals Group     BP 07/22/18 1523 (!) 114/95     Pulse Rate 07/22/18 1523 (!) 110     Resp 07/22/18 1523 16     Temp 07/22/18 1523 99.2 F (37.3 C)     Temp Source 07/22/18 1523 Oral     SpO2 07/22/18 1519 97 %     Weight 07/22/18 1524 157 lb (71.2 kg)     Height 07/22/18 1524  (1.854 m)     Head Circumference --      Peak Flow --      Pain Score 07/22/18 1524 7     Pain Loc --      Pain Edu? --      Excl. in GC? --   TMAX(24)@       Latest  Blood pressure 119/82, pulse 96, temperature 99.2 F (37.3 C), temperature source Oral, resp. rate 16, height  (1.854 m), weight 71.2 kg, SpO2 99 %.    Hospitalist was called for admission for  Gi bleed   Review of Systems:    Pertinent positives include:  fatigue,   Constitutional:  No weight loss,  night sweats, Fevers, chills,weight loss  HEENT:  No headaches, Difficulty swallowing,Tooth/dental problems,Sore throat,  No sneezing,  itching, ear ache, nasal congestion, post nasal drip,  Cardio-vascular:  No chest pain, Orthopnea, PND, anasarca, dizziness, palpitations.no Bilateral lower extremity swelling  GI:  No heartburn, indigestion, abdominal pain, nausea, vomiting, diarrhea, change in bowel habits, loss of appetite, melena, blood in stool, hematemesis Resp:  no shortness of breath at rest. No dyspnea on exertion, No excess mucus, no productive cough, No non-productive cough, No coughing up of blood.No change in color of mucus.No wheezing. Skin:  no rash or lesions. No jaundice GU:  no dysuria, change in color of urine, no urgency or frequency. No straining to urinate.  No flank pain.  Musculoskeletal:  No joint pain or no joint swelling. No decreased range of motion. No back pain.  Psych:  No change in mood or affect. No depression or anxiety. No memory loss.  Neuro: no localizing neurological complaints, no tingling, no weakness, no double vision, no gait abnormality, no slurred speech, no confusion  All systems reviewed and apart from HOPI all are negative  Past Medical History:  History reviewed. No pertinent past medical history.    History reviewed. No pertinent surgical history.  Social History:  Ambulatory  independently       reports that he has been smoking. He has been smoking about 0.50 packs per day. He does not have any smokeless tobacco history on file. He reports current alcohol use. He reports current drug use. Frequency: 7.00 times per week. Drug: Marijuana.     Family History:   Family History  Problem Relation Age of Onset   Diabetes Other     Allergies: No Known Allergies   Prior to Admission medications   Medication Sig Start Date End Date Taking? Authorizing Provider  acetaminophen (TYLENOL) 325 MG tablet Take 650 mg by mouth every 4 (four) hours as needed for moderate pain or headache.   Yes [provider]  ibuprofen (ADVIL,MOTRIN) 200 MG tablet Take 400 mg  by mouth every 6 (six) hours as needed for headache or moderate pain.   Yes [provider]  amoxicillin (AMOXIL) 500 MG capsule Take 1 capsule (500 mg total) by mouth 3 (three) times daily. Patient not taking: Reported on 07/22/2018 04/09/15   Janne Napoleon, NP  naproxen (NAPROSYN) 500 MG tablet Take 1 tablet (500 mg total) by mouth 2 (two) times daily. Patient not taking: Reported on 07/22/2018 04/09/15   Janne Napoleon, NP   Physical Exam: Blood pressure 119/82, pulse 96, temperature 99.2 F (37.3 C), temperature source Oral, resp. rate 16, height  (1.854 m), weight 71.2 kg, SpO2 99 %. 1. General:  in  Acute distress complaining of severe pain -appearing 2. Psychological: Alert and  Oriented 3. Head/ENT:    Dry Mucous Membranes                          Head Non traumatic, neck supple                          Poor Dentition 4. SKIN:   decreased Skin turgor,  Skin clean Dry and intact no rash, redness and swelling of Right hand and left foot  5. Heart: Regular rate and rhythm no Murmur, no Rub or gallop 6. Lungs:  no wheezes or crackles   7. Abdomen: Soft,  non-tender, Non distended bowel sounds  present 8. Lower extremities: no clubbing, cyanosis, no  edema 9. Neurologically Grossly intact, moving all 4 extremities equally   10. MSK: Normal range of motion   LABS:     Recent Labs  Lab 07/22/18 1526  WBC 28.7*  NEUTROABS 24.4*  HGB 7.3*  HCT 26.3*  MCV 68.1*  PLT 259   Basic Metabolic Panel: Recent Labs  Lab 07/22/18 1526  NA 131*  K 4.0  CL 96*  CO2 24  GLUCOSE 116*  BUN 18  CREATININE 0.73  CALCIUM 8.7*      Recent Labs  Lab 07/22/18 1526  AST 17  ALT 16  ALKPHOS 63  BILITOT 0.5  PROT 6.9  ALBUMIN 2.9*   No results for input(s): LIPASE, AMYLASE in the last 168 hours. No results for input(s): AMMONIA in the last 168 hours.    HbA1C: No results for input(s): HGBA1C in the last 72 hours. CBG: No results for input(s): GLUCAP in the last 168  hours.    Urine analysis:    Component Value Date/Time   COLORURINE YELLOW 07/22/2018 1526   APPEARANCEUR HAZY (A) 07/22/2018 1526   LABSPEC 1.032 (H) 07/22/2018 1526   PHURINE 5.0 07/22/2018 1526   GLUCOSEU NEGATIVE 07/22/2018 1526   HGBUR NEGATIVE 07/22/2018 1526   BILIRUBINUR NEGATIVE 07/22/2018 1526   KETONESUR 5 (A) 07/22/2018 1526   PROTEINUR 100 (A) 07/22/2018 1526   NITRITE NEGATIVE 07/22/2018 1526   LEUKOCYTESUR NEGATIVE 07/22/2018 1526       Cultures: No results found for: SDES, SPECREQUEST, CULT, REPTSTATUS   Radiological Exams on Admission: Dg Chest 2 View  Result Date: 07/22/2018 CLINICAL DATA:  48 year old male with flu like symptoms. Body aches and pain. Smoker. EXAM: CHEST - 2 VIEW COMPARISON:  None. FINDINGS: Lung volumes at the upper limits of normal. Mediastinal contours are within normal limits. Visualized tracheal air column is within normal limits. No pneumothorax, pleural effusion or confluent pulmonary opacity. Mild diffuse increased pulmonary interstitial markings. No acute osseous abnormality identified. Negative visible bowel gas pattern. IMPRESSION: Mild diffuse increased pulmonary interstitial markings which could be smoking related, but consider viral/atypical respiratory infection in this clinical setting. No pleural effusion. Electronically Signed   By: Odessa Fleming M.D.   On: 07/22/2018 16:46   Dg Wrist Complete Right  Result Date: 07/22/2018 CLINICAL DATA:  48 year old male with flu like symptoms. Body aches. Pain. Fall several days ago with right upper extremity pain and swelling. EXAM: RIGHT WRIST - COMPLETE 3+ VIEW COMPARISON:  None. FINDINGS: Bone mineralization is within normal limits. Generalized soft tissue swelling. Distal radius and ulna are intact. Carpal bones appear intact and normally aligned. Chronic appearing degenerative spurring and mild fragmentation at the base of the 5th metacarpal. No acute fracture identified. IMPRESSION: Chronic  appearing changes at the base of the 5th metacarpal. No acute fracture or dislocation identified. Electronically Signed   By: Odessa Fleming M.D.   On: 07/22/2018 16:48   Ct Head Wo Contrast  Result Date: 07/22/2018 CLINICAL DATA:  Pain across the neck and shoulders bilaterally. Hot and cold chills. EXAM: CT HEAD WITHOUT CONTRAST CT CERVICAL SPINE WITHOUT CONTRAST TECHNIQUE: Multidetector CT imaging of the head and cervical spine was performed following the standard protocol without intravenous contrast. Multiplanar CT image reconstructions of the cervical spine were also generated. COMPARISON:  None. FINDINGS: CT HEAD FINDINGS Brain: No evidence of acute infarction, hemorrhage, hydrocephalus, extra-axial collection or mass lesion/mass effect. Vascular: No hyperdense vessel or unexpected calcification. Skull: Normal. Negative  for fracture or focal lesion. Sinuses/Orbits: No acute finding. Other: None. CT CERVICAL SPINE FINDINGS Alignment: Slight reversal cervical lordosis at C5-6 attributable to degenerative disc disease. Skull base and vertebrae: Intact skull base. Bone island noted of the C4 vertebral body on the left. No acute cervical spine fracture. Uncovertebral joint erosive osteoarthritis with subchondral erosive changes noted at C5-6 on the right. Jumped or perched facets. Soft tissues and spinal canal: No prevertebral fluid or swelling. No visible canal hematoma. Disc levels: Moderate disc flattening C5-6 with small posterior marginal osteophytes. No significant foraminal encroachment. Upper chest: Negative. Other: None IMPRESSION: 1. No acute intracranial abnormality. 2. 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 No acute cervical spine fracture. Electronically Signed   By: Tollie Eth M.D.   On: 07/22/2018 20:14   Ct Chest Wo Contrast  Result Date: 07/23/2018 CLINICAL DATA:   General body aches, pain across the shoulders for 5 days. Larey Seat several days ago. EXAM: CT CHEST WITHOUT CONTRAST TECHNIQUE: Multidetector CT imaging of the chest was performed following the standard protocol without IV contrast. COMPARISON:  None. FINDINGS: Cardiovascular: Evaluation of vascular structures is limited without IV contrast material. Normal heart size. No pericardial effusion. Scattered coronary artery calcifications. Normal caliber thoracic aorta. Scattered aortic calcification. Mediastinum/Nodes: Esophagus is decompressed. No significant mediastinal lymphadenopathy. Moderately prominent axillary lymph nodes without pathologic enlargement, likely reactive. Lungs/Pleura: Emphysematous changes in the lungs. Mild dependent atelectasis. No airspace disease or consolidation is suggested. No pleural effusions. No pneumothorax. Airways are patent. Upper Abdomen: No acute abnormalities. Musculoskeletal: Normal alignment of the thoracic spine. No destructive bone lesions. IMPRESSION: No evidence of active pulmonary disease. Emphysematous changes in the lungs. Aortic Atherosclerosis (ICD10-I70.0) and Emphysema (ICD10-J43.9). Electronically Signed   By: Burman Nieves M.D.   On: 07/23/2018 01:01   Ct Cervical Spine Wo Contrast  Result Date: 07/22/2018 CLINICAL DATA:  Pain across the neck and shoulders bilaterally. Hot and cold chills. EXAM: CT HEAD WITHOUT CONTRAST CT CERVICAL SPINE WITHOUT CONTRAST TECHNIQUE: Multidetector CT imaging of the head and cervical spine was performed following the standard protocol without intravenous contrast. Multiplanar CT image reconstructions of the cervical spine were also generated. COMPARISON:  None. FINDINGS: CT HEAD FINDINGS Brain: No evidence of acute infarction, hemorrhage, hydrocephalus, extra-axial collection or mass lesion/mass effect. Vascular: No hyperdense vessel or unexpected calcification. Skull: Normal. Negative for fracture or focal lesion. Sinuses/Orbits:  No acute finding. Other: None. CT CERVICAL SPINE FINDINGS Alignment: Slight reversal cervical lordosis at C5-6 attributable to degenerative disc disease. Skull base and vertebrae: Intact skull base. Bone island noted of the C4 vertebral body on the left. No acute cervical spine fracture. Uncovertebral joint erosive osteoarthritis with subchondral erosive changes noted at C5-6 on the right. Jumped or perched facets. Soft tissues and spinal canal: No prevertebral fluid or swelling. No visible canal hematoma. Disc levels: Moderate disc flattening C5-6 with small posterior marginal osteophytes. No significant foraminal encroachment. Upper chest: Negative. Other: None IMPRESSION: 1. No acute intracranial abnormality. 2. 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 No acute cervical spine fracture. Electronically Signed   By: Tollie Eth M.D.   On: 07/22/2018 20:14   Dg Hand Complete Right  Result Date: 07/22/2018 CLINICAL DATA:  Initial evaluation for acute general right hand pain with swelling. EXAM: RIGHT HAND - COMPLETE 3+ VIEW COMPARISON:  None. FINDINGS: No acute fracture or dislocation. Chronic changes at the base of the right fifth metacarpal. Joint spaces otherwise maintained without evidence for significant degenerative or erosive arthropathy. No discrete osseous lesions. No soft tissue abnormality. IMPRESSION: 1. No acute osseous abnormality. 2. Chronic changes at the base of the right fifth metacarpal. Electronically Signed   By: Rise Mu M.D.   On: 07/22/2018 20:04   Dg Foot Complete Left  Result Date: 07/22/2018 CLINICAL DATA:  Initial evaluation for acute generalized left foot pain with swelling. EXAM: LEFT FOOT - COMPLETE 3+ VIEW COMPARISON:  None. FINDINGS: Chronic changes at the medial tarsometatarsal articulations. Joint spaces otherwise maintained without  evidence for significant degenerative or erosive arthropathy. No acute fracture dislocation. No discrete osseous lesions. No soft tissue abnormality. IMPRESSION: 1. No acute osseous abnormality. 2. Chronic changes at the medial tarsometatarsal articulations. Electronically Signed   By: Rise Mu M.D.   On: 07/22/2018 20:07    Chart has been reviewed   Assessment/Plan  48 y.o. male with medical history significant of  Duodenitis, EtOh abuse  Admitted for upper gi bleed and possible gout flair multiple sites of cellulitis being less likely and SIRS  Present on Admission:  Upper GI bleed -  - Glasgow Blatchford score , Hg <82M  systolic BP    HR >100   , melena    >1 Justifies admission and aggressive management      Modifying risk factors include:   NSAIDS use hx of PUD    anticoagulation,  alcohol abuse Prior hx of GI bleed       -    AIMS 65 = Alb <3, TOTAL of 1          Worrisome       -   hemodynamic instability present      -  Admit to stepdown given above    - attempted to page  gastroenterology was told to call in AM  Please consult in AM   - serial CBC.    - Monitor for any recurrence,  evidence of hemodynamic instability or significant blood loss  - Transfuse as needed for hemoglobin below 7 or evidence of life-threatening bleeding  - Establish at least 2 PIV and fluid resuscitate   - clear liquids for tonight keep nothing by mouth post midnight,   -  administer Protonix  twice a day      SIRS -   -Patient meets sepsis criteria with subjective fever    leukocytosis   Tachycardia   Initial lactic acid Lactic Acid, Venous    Component Value Date/Time   LATICACIDVEN 1.5 07/22/2018 1526   Source most likely:  Cellulitis vs viral   -We will rehydrate, treat with IV antibiotics, follow lactic acid - Await results of blood and urine culture and adjust antibiotics as needed - Obtain MRSA serologies  - Obtain respiratory panel   CHECK HIV status Work-up otherwise  negative may need investigation for autoimmune disorder      Alcohol abuse - CIWA protocol and social work consult  Abnormal CXR -obtain respiratory panel influenza PCR  to further evaluate patient reports shortness of breath subjective fevers, CT chest done showing emphysematous changes but no evidence of acute pulmonary disease   Dehydration - we will rehydrate and follow    Symptomatic anemia obtain serial CBC and transfuse 1 unit arm and  Leg swelling - unclear etiology denies IV drug use denies history of gout no prior similar symptoms, given  involvement of both extremities cellulitis being less likely but given significantly elevated white blood cell count for now will initiate antibiotics and discussed with orthopedics to see if he may need joint aspiration for further evaluation. Given melena and significant anemia with Hemoccult positive stools and prior history of duodenitis ongoing alcohol abuse would not be a good candidate for his nonsteroidals or steroids at this time unless had a further GI work-up   Hyponatremia -the setting of alcohol abuse will obtain urine electrolytes gently rehydrate and follow  History of pica currently states in remission may benefit from nutritional consult Other plan as per orders.  DVT prophylaxis:  SCD    Code Status:  FULL CODE   as per patient   I had personally discussed CODE STATUS with patient    Family Communication:   Family  Not at  Bedside    Disposition Plan:    To home once workup is complete and patient is stable                     Would benefit from PT/OT eval prior to DC  Ordered                     Social Work  consulted                   Nutrition    consulted                                      Consults called: Orthopedics are aware Dr. Ave Filterhandler  Admission status:  Obs    Level of care      SDU tele indefinitely please discontinue once patient no longer qualifies     Cadi Rhinehart 07/23/2018, 12:23 AM     [facility]     after 2 AM please page floor coverage PA If 7AM-7PM, please contact the day team taking care of the patient using [email or web address]

## 2018-07-23 ENCOUNTER — Encounter (HOSPITAL_COMMUNITY): Payer: Self-pay | Admitting: Internal Medicine

## 2018-07-23 ENCOUNTER — Other Ambulatory Visit: Payer: Self-pay

## 2018-07-23 DIAGNOSIS — E871 Hypo-osmolality and hyponatremia: Secondary | ICD-10-CM | POA: Diagnosis present

## 2018-07-23 DIAGNOSIS — F5089 Other specified eating disorder: Secondary | ICD-10-CM | POA: Diagnosis present

## 2018-07-23 DIAGNOSIS — D500 Iron deficiency anemia secondary to blood loss (chronic): Secondary | ICD-10-CM

## 2018-07-23 DIAGNOSIS — D509 Iron deficiency anemia, unspecified: Secondary | ICD-10-CM

## 2018-07-23 DIAGNOSIS — R6510 Systemic inflammatory response syndrome (SIRS) of non-infectious origin without acute organ dysfunction: Secondary | ICD-10-CM | POA: Diagnosis present

## 2018-07-23 LAB — URINALYSIS, ROUTINE W REFLEX MICROSCOPIC
Bacteria, UA: NONE SEEN
Bilirubin Urine: NEGATIVE
Glucose, UA: NEGATIVE mg/dL
HGB URINE DIPSTICK: NEGATIVE
Ketones, ur: 5 mg/dL — AB
Leukocytes,Ua: NEGATIVE
Nitrite: NEGATIVE
Protein, ur: 100 mg/dL — AB
Specific Gravity, Urine: 1.032 — ABNORMAL HIGH (ref 1.005–1.030)
pH: 5 (ref 5.0–8.0)

## 2018-07-23 LAB — COMPREHENSIVE METABOLIC PANEL
ALT: 14 U/L (ref 0–44)
AST: 16 U/L (ref 15–41)
Albumin: 2.6 g/dL — ABNORMAL LOW (ref 3.5–5.0)
Alkaline Phosphatase: 60 U/L (ref 38–126)
Anion gap: 9 (ref 5–15)
BUN: 12 mg/dL (ref 6–20)
CO2: 22 mmol/L (ref 22–32)
Calcium: 8.4 mg/dL — ABNORMAL LOW (ref 8.9–10.3)
Chloride: 101 mmol/L (ref 98–111)
Creatinine, Ser: 0.67 mg/dL (ref 0.61–1.24)
GFR calc non Af Amer: >60 mL/min (ref >60)
Glucose, Bld: 96 mg/dL (ref 70–99)
Potassium: 4 mmol/L (ref 3.5–5.1)
Sodium: 132 mmol/L — ABNORMAL LOW (ref 135–145)
Total Bilirubin: 1 mg/dL (ref 0.3–1.2)
Total Protein: 6.4 g/dL — ABNORMAL LOW (ref 6.5–8.1)

## 2018-07-23 LAB — CREATININE, URINE, RANDOM: Creatinine, Urine: 337.94 mg/dL

## 2018-07-23 LAB — ABO/RH: ABO/RH(D): A POS

## 2018-07-23 LAB — SODIUM, URINE, RANDOM: Sodium, Ur: 43 mmol/L

## 2018-07-23 LAB — ACETAMINOPHEN LEVEL: Acetaminophen (Tylenol), Serum: <10 ug/mL — ABNORMAL LOW (ref 10–30)

## 2018-07-23 LAB — RESPIRATORY PANEL BY PCR
Adenovirus: NOT DETECTED
Bordetella pertussis: NOT DETECTED
CORONAVIRUS 229E-RVPPCR: NOT DETECTED
Chlamydophila pneumoniae: NOT DETECTED
Coronavirus HKU1: NOT DETECTED
Coronavirus NL63: NOT DETECTED
Coronavirus OC43: NOT DETECTED
Influenza A: NOT DETECTED
Influenza B: NOT DETECTED
MYCOPLASMA PNEUMONIAE-RVPPCR: NOT DETECTED
Metapneumovirus: NOT DETECTED
PARAINFLUENZA VIRUS 3-RVPPCR: NOT DETECTED
Parainfluenza Virus 1: NOT DETECTED
Parainfluenza Virus 2: NOT DETECTED
Parainfluenza Virus 4: NOT DETECTED
Respiratory Syncytial Virus: NOT DETECTED
Rhinovirus / Enterovirus: NOT DETECTED

## 2018-07-23 LAB — LACTIC ACID, PLASMA: Lactic Acid, Venous: 0.8 mmol/L (ref 0.5–1.9)

## 2018-07-23 LAB — CBC
HCT: 24.7 % — ABNORMAL LOW (ref 39.0–52.0)
HEMATOCRIT: 29.5 % — AB (ref 39.0–52.0)
HEMOGLOBIN: 8.3 g/dL — AB (ref 13.0–17.0)
Hemoglobin: 6.8 g/dL — CL (ref 13.0–17.0)
MCH: 19.1 pg — AB (ref 26.0–34.0)
MCH: 20.1 pg — ABNORMAL LOW (ref 26.0–34.0)
MCHC: 27.5 g/dL — ABNORMAL LOW (ref 30.0–36.0)
MCHC: 28.1 g/dL — ABNORMAL LOW (ref 30.0–36.0)
MCV: 69.4 fL — AB (ref 80.0–100.0)
MCV: 71.6 fL — ABNORMAL LOW (ref 80.0–100.0)
Platelets: 253 10*3/uL (ref 150–400)
Platelets: 287 10*3/uL (ref 150–400)
RBC: 3.56 MIL/uL — ABNORMAL LOW (ref 4.22–5.81)
RBC: 4.12 MIL/uL — ABNORMAL LOW (ref 4.22–5.81)
RDW: 21.6 % — ABNORMAL HIGH (ref 11.5–15.5)
RDW: 23.4 % — ABNORMAL HIGH (ref 11.5–15.5)
WBC: 27.5 10*3/uL — ABNORMAL HIGH (ref 4.0–10.5)
WBC: 24.4 10*3/uL — ABNORMAL HIGH (ref 4.0–10.5)
nRBC: 0.1 % (ref 0.0–0.2)
nRBC: 0 % (ref 0.0–0.2)

## 2018-07-23 LAB — IRON AND TIBC
Iron: 13 ug/dL — ABNORMAL LOW (ref 45–182)
Saturation Ratios: 4 % — ABNORMAL LOW (ref 17.9–39.5)
TIBC: 335 ug/dL (ref 250–450)
UIBC: 322 ug/dL

## 2018-07-23 LAB — PROCALCITONIN: Procalcitonin: 0.93 ng/mL

## 2018-07-23 LAB — MRSA PCR SCREENING: MRSA by PCR: NEGATIVE

## 2018-07-23 LAB — OSMOLALITY, URINE: Osmolality, Ur: 915 mOsm/kg — ABNORMAL HIGH (ref 300–900)

## 2018-07-23 LAB — TSH: TSH: 1.817 u[IU]/mL (ref 0.350–4.500)

## 2018-07-23 LAB — CK: Total CK: 23 U/L — ABNORMAL LOW (ref 49–397)

## 2018-07-23 LAB — PHOSPHORUS: Phosphorus: 4 mg/dL (ref 2.5–4.6)

## 2018-07-23 LAB — TROPONIN I: Troponin I: <0.03 ng/mL (ref <0.03)

## 2018-07-23 LAB — C-REACTIVE PROTEIN: CRP: 30.2 mg/dL — ABNORMAL HIGH (ref <1.0)

## 2018-07-23 LAB — PROTIME-INR
INR: 1 (ref 0.8–1.2)
Prothrombin Time: 13.4 seconds (ref 11.4–15.2)

## 2018-07-23 LAB — PREALBUMIN: Prealbumin: 7.7 mg/dL — ABNORMAL LOW (ref 18–38)

## 2018-07-23 LAB — RAPID URINE DRUG SCREEN, HOSP PERFORMED
Amphetamines: NOT DETECTED
Barbiturates: NOT DETECTED
Benzodiazepines: NOT DETECTED
Cocaine: NOT DETECTED
Opiates: NOT DETECTED
Tetrahydrocannabinol: POSITIVE — AB

## 2018-07-23 LAB — URIC ACID: Uric Acid, Serum: 2.7 mg/dL — ABNORMAL LOW (ref 3.7–8.6)

## 2018-07-23 LAB — INFLUENZA PANEL BY PCR (TYPE A & B)
INFLBPCR: NEGATIVE
Influenza A By PCR: NEGATIVE

## 2018-07-23 LAB — SEDIMENTATION RATE: Sed Rate: 73 mm/hr — ABNORMAL HIGH (ref 0–16)

## 2018-07-23 LAB — MAGNESIUM: Magnesium: 2.2 mg/dL (ref 1.7–2.4)

## 2018-07-23 LAB — PREPARE RBC (CROSSMATCH)

## 2018-07-23 LAB — FERRITIN: Ferritin: 74 ng/mL (ref 24–336)

## 2018-07-23 MED ORDER — PANTOPRAZOLE SODIUM 40 MG IV SOLR
40.0000 mg | Freq: Two times a day (BID) | INTRAVENOUS | Status: DC
  Administered 2018-07-23 – 2018-07-26 (×8): 40 mg via INTRAVENOUS
  Filled 2018-07-23 (×8): qty 40

## 2018-07-23 MED ORDER — INFLUENZA VAC SPLIT QUAD 0.5 ML IM SUSY
0.5000 mL | PREFILLED_SYRINGE | INTRAMUSCULAR | Status: AC
  Administered 2018-07-24: 0.5 mL via INTRAMUSCULAR
  Filled 2018-07-23: qty 0.5

## 2018-07-23 MED ORDER — IBUPROFEN 800 MG PO TABS
800.0000 mg | ORAL_TABLET | Freq: Three times a day (TID) | ORAL | Status: DC | PRN
  Administered 2018-07-23 – 2018-07-24 (×2): 800 mg via ORAL
  Filled 2018-07-23 (×2): qty 1

## 2018-07-23 MED ORDER — ONDANSETRON HCL 4 MG/2ML IJ SOLN: 4.0000 mg | Freq: Four times a day (QID) | INTRAMUSCULAR | Status: DC | PRN

## 2018-07-23 MED ORDER — LIDOCAINE HCL (PF) 1 % IJ SOLN
INTRAMUSCULAR | Status: AC
  Filled 2018-07-23: qty 30

## 2018-07-23 MED ORDER — ONDANSETRON HCL 4 MG PO TABS: 4.0000 mg | ORAL_TABLET | Freq: Four times a day (QID) | ORAL | Status: DC | PRN

## 2018-07-23 MED ORDER — PANTOPRAZOLE SODIUM 40 MG IV SOLR: 40.0000 mg | Freq: Every day | INTRAVENOUS | Status: DC

## 2018-07-23 MED ORDER — ACETAMINOPHEN 325 MG PO TABS
650.0000 mg | ORAL_TABLET | Freq: Once | ORAL | Status: AC
  Administered 2018-07-23: 650 mg via ORAL
  Filled 2018-07-23: qty 2

## 2018-07-23 MED ORDER — VANCOMYCIN HCL 10 G IV SOLR
1500.0000 mg | Freq: Two times a day (BID) | INTRAVENOUS | Status: DC
  Administered 2018-07-23 – 2018-07-26 (×6): 1500 mg via INTRAVENOUS
  Filled 2018-07-23 (×9): qty 1500

## 2018-07-23 MED ORDER — VANCOMYCIN HCL 10 G IV SOLR
1500.0000 mg | Freq: Once | INTRAVENOUS | Status: AC
  Administered 2018-07-23: 1500 mg via INTRAVENOUS
  Filled 2018-07-23: qty 1500

## 2018-07-23 MED ORDER — SODIUM CHLORIDE 0.9 % IV SOLN
INTRAVENOUS | Status: AC
  Administered 2018-07-23: 16:00:00 via INTRAVENOUS

## 2018-07-23 MED ORDER — LEVALBUTEROL HCL 0.63 MG/3ML IN NEBU: 0.6300 mg | INHALATION_SOLUTION | Freq: Four times a day (QID) | RESPIRATORY_TRACT | Status: DC | PRN

## 2018-07-23 MED ORDER — COLCHICINE 0.6 MG PO TABS
0.6000 mg | ORAL_TABLET | Freq: Two times a day (BID) | ORAL | Status: DC
  Administered 2018-07-23 – 2018-07-28 (×10): 0.6 mg via ORAL
  Filled 2018-07-23 (×11): qty 1

## 2018-07-23 MED ORDER — SODIUM CHLORIDE 0.9% IV SOLUTION
Freq: Once | INTRAVENOUS | Status: AC
  Administered 2018-07-24: 08:00:00 via INTRAVENOUS

## 2018-07-23 MED ORDER — MORPHINE SULFATE (PF) 2 MG/ML IV SOLN
1.0000 mg | INTRAVENOUS | Status: DC | PRN
  Administered 2018-07-23 – 2018-07-24 (×3): 2 mg via INTRAVENOUS
  Filled 2018-07-23 (×3): qty 1

## 2018-07-23 NOTE — Progress Notes (Signed)
Pharmacy Antibiotic Note  Dillon Schaefer is a 48 y.o. male admitted on 07/22/2018 with Cellulitis.  Pharmacy has been consulted for Vancomycin dosing.  Plan: Vancomycin 1500mg  iv x1, then Vancomycin 1500 mg IV Q 12 hrs. Goal AUC 400-550. Expected AUC: 525 SCr used: 0.8 (adjusted)   Height: 6\' 1"  (185.4 cm) Weight: 157 lb (71.2 kg) IBW/kg (Calculated) : 79.9  Temp (24hrs), Avg:99.8 F (37.7 C), Min:99.2 F (37.3 C), Max:100.4 F (38 C)  Recent Labs  Lab 07/22/18 1526 07/23/18 0150  WBC 28.7* 27.5*  CREATININE 0.73  --   LATICACIDVEN 1.5 0.8    Estimated Creatinine Clearance: 115 mL/min (by C-G formula based on SCr of 0.73 mg/dL).    No Known Allergies  Antimicrobials this admission: Vancomycin 07/22/2018 >>  Dose adjustments this admission: -  Microbiology results: -  Thank you for allowing pharmacy to be a part of this patient's care.  Aleene Davidson Crowford 07/23/2018 6:55 AM

## 2018-07-23 NOTE — ED Notes (Signed)
ED TO INPATIENT HANDOFF REPORT  ED Nurse Name and Phone #: Kerrie Buffalo Name/Age/Gender Dillon Schaefer 48 y.o. male Room/Bed: WA18/WA18  Code Status   Code Status: Full Code  Home/SNF/Other Home Patient oriented to: self, place, time and situation Is this baseline? Yes   Triage Complete: Triage complete  Chief Complaint flu like symptoms  Triage Note Pt BIBA from home. Pt states 4-5 days of pain across neck and shoulders bilaterally. Pt states he has had hot and cold chills at home. Pt states he is now having generalized body aches and weakness.  Pt states he has been taking ibuprofen and acetaminophen without relief. Pt has had within 4 hours.  Pt also states that he fell several days ago, injuring right arm with some swelling. No LOC, didn't hit head.   Allergies No Known Allergies  Level of Care/Admitting Diagnosis ED Disposition    ED Disposition Condition Comment   Admit  Hospital Area: [facility] [[id number]]  Level of Care: Stepdown [14]  Admit to SDU based on following criteria: Severe physiological/psychological symptoms:  Any diagnosis requiring assessment & intervention at least every 4 hours on an ongoing basis to obtain desired patient outcomes including stability and rehabilitation  Diagnosis: Upper GI bleed [161096]  Admitting Physician: Kathlen Mody [[id number]]  Attending Physician: Kathlen Mody [[id number]]  Estimated length of stay: past midnight tomorrow  Certification:: I certify this patient will need inpatient services for at least 2 midnights  PT Class (Do Not Modify): Inpatient [101]  PT Acc Code (Do Not Modify): Private [1]       B Medical/Surgery History History reviewed. No pertinent past medical history. History reviewed. No pertinent surgical history.   A IV Location/Drains/Wounds Patient Lines/Drains/Airways Status   Active Line/Drains/Airways    Name:   Placement date:   Placement time:   Site:   Days:   Peripheral IV  07/22/18   07/22/18    2003    -   1   Peripheral IV 07/23/18 Right Forearm   07/23/18    0800    Forearm   less than 1          Intake/Output Last 24 hours  Intake/Output Summary (Last 24 hours) at 07/23/2018 2119 Last data filed at 07/23/2018 1059 Gross per 24 hour  Intake 638 ml  Output -  Net 638 ml    Labs/Imaging Results for orders placed or performed during the hospital encounter of 07/22/18 (from the past 48 hour(s))  Influenza panel by PCR (type A & B)     Status: None   Collection Time: 07/22/18 10:49 AM  Result Value Ref Range   Influenza A By PCR NEGATIVE NEGATIVE   Influenza B By PCR NEGATIVE NEGATIVE    Comment: (NOTE) The Xpert Xpress Flu assay is intended as an aid in the diagnosis of  influenza and should not be used as a sole basis for treatment.  This  assay is FDA approved for nasopharyngeal swab specimens only. Nasal  washings and aspirates are unacceptable for Xpert Xpress Flu testing. Performed at [facility], [address], [place], [place] [address]   Lactic acid, plasma     Status: None   Collection Time: 07/22/18  3:26 PM  Result Value Ref Range   Lactic Acid, Venous 1.5 0.5 - 1.9 mmol/L    Comment: Performed at [facility], [address], [place], [place] [address]  Comprehensive metabolic panel  Status: Abnormal   Collection Time: 07/22/18  3:26 PM  Result Value Ref Range   Sodium 131 (L) 135 - 145 mmol/L   Potassium 4.0 3.5 - 5.1 mmol/L   Chloride 96 (L) 98 - 111 mmol/L   CO2 24 22 - 32 mmol/L   Glucose, Bld 116 (H) 70 - 99 mg/dL   BUN 18 6 - 20 mg/dL   Creatinine, Ser 4.74 0.61 - 1.24 mg/dL   Calcium 8.7 (L) 8.9 - 10.3 mg/dL   Total Protein 6.9 6.5 - 8.1 g/dL   Albumin 2.9 (L) 3.5 - 5.0 g/dL   AST 17 15 - 41 U/L   ALT 16 0 - 44 U/L   Alkaline Phosphatase 63 38 - 126 U/L   Total Bilirubin 0.5 0.3 - 1.2 mg/dL   GFR calc non Af Amer >60 >60 mL/min   GFR calc Af Amer >60 >60 mL/min   Anion  gap 11 5 - 15    Comment: Performed at [facility], [address], [place], [place] [address]  CBC with Differential     Status: Abnormal   Collection Time: 07/22/18  3:26 PM  Result Value Ref Range   WBC 28.7 (H) 4.0 - 10.5 K/uL   RBC 3.86 (L) 4.22 - 5.81 MIL/uL   Hemoglobin 7.3 (L) 13.0 - 17.0 g/dL    Comment: Reticulocyte Hemoglobin testing may be clinically indicated, consider ordering this additional test [id number]    HCT 26.3 (L) 39.0 - 52.0 %   MCV 68.1 (L) 80.0 - 100.0 fL   MCH 18.9 (L) 26.0 - 34.0 pg   MCHC 27.8 (L) 30.0 - 36.0 g/dL   RDW 29.5 (H) 18.8 - 41.6 %   Platelets 259 150 - 400 K/uL   nRBC 0.1 0.0 - 0.2 %   Neutrophils Relative % 84 %   Neutro Abs 24.4 (H) 1.7 - 7.7 K/uL   Lymphocytes Relative 4 %   Lymphs Abs 1.0 0.7 - 4.0 K/uL   Monocytes Relative 10 %   Monocytes Absolute 2.8 (H) 0.1 - 1.0 K/uL   Eosinophils Relative 0 %   Eosinophils Absolute 0.0 0.0 - 0.5 K/uL   Basophils Relative 0 %   Basophils Absolute 0.1 0.0 - 0.1 K/uL   Immature Granulocytes 2 %   Abs Immature Granulocytes 0.44 (H) 0.00 - 0.07 K/uL   Dohle Bodies PRESENT    Target Cells PRESENT    Ovalocytes PRESENT     Comment: Performed at [facility], [address], [place], [place] [address]  Urinalysis, Routine w reflex microscopic     Status: Abnormal   Collection Time: 07/22/18  3:26 PM  Result Value Ref Range   Color, Urine YELLOW YELLOW   APPearance HAZY (A) CLEAR   Specific Gravity, Urine 1.032 (H) 1.005 - 1.030   pH 5.0 5.0 - 8.0   Glucose, UA NEGATIVE NEGATIVE mg/dL   Hgb urine dipstick NEGATIVE NEGATIVE   Bilirubin Urine NEGATIVE NEGATIVE   Ketones, ur 5 (A) NEGATIVE mg/dL   Protein, ur 160 (A) NEGATIVE mg/dL   Nitrite NEGATIVE NEGATIVE   Leukocytes,Ua NEGATIVE NEGATIVE   RBC / HPF 0-5 0 - 5 RBC/hpf   WBC, UA 11-20 0 - 5 WBC/hpf   Bacteria, UA NONE SEEN NONE SEEN   Squamous Epithelial / LPF 0-5 0 - 5   Mucus PRESENT    Hyaline  Casts, UA PRESENT     Comment: Performed at [facility],  [address], [place], [place] [address]  Occult blood, poc device     Status: Abnormal   Collection Time: 07/22/18 10:11 PM  Result Value Ref Range   Fecal Occult Bld POSITIVE (A) NEGATIVE  MRSA PCR Screening     Status: None   Collection Time: 07/23/18  1:14 AM  Result Value Ref Range   MRSA by PCR NEGATIVE NEGATIVE    Comment:        The GeneXpert MRSA Assay (FDA approved for NASAL specimens only), is one component of a comprehensive MRSA colonization surveillance program. It is not intended to diagnose MRSA infection nor to guide or monitor treatment for MRSA infections. Performed at [facility], [address], [place], [place] [address]   Type and screen [facility]     Status: None (Preliminary result)   Collection Time: 07/23/18  1:50 AM  Result Value Ref Range   ABO/RH(D) A POS    Antibody Screen NEG    Sample Expiration 07/26/2018    Unit Number [id number]    Blood Component Type RED CELLS,LR    Unit division 00    Status of Unit ISSUED    Transfusion Status OK TO TRANSFUSE    Crossmatch Result      Compatible Performed at [facility], [address], [place], [place] [address]   Protime-INR     Status: None   Collection Time: 07/23/18  1:50 AM  Result Value Ref Range   Prothrombin Time 13.4 11.4 - 15.2 seconds   INR 1.0 0.8 - 1.2    Comment: (NOTE) INR goal varies based on device and disease states. Performed at [facility], [address] [place], [place] [address]   Troponin I - Add-On to previous collection     Status: None   Collection Time: 07/23/18  1:50 AM  Result Value Ref Range   Troponin I <0.03 <0.03 ng/mL    Comment: Performed at [facility], [address], [place], [place] [address]  Lactic acid, plasma     Status: None   Collection Time: 07/23/18  1:50 AM   Result Value Ref Range   Lactic Acid, Venous 0.8 0.5 - 1.9 mmol/L    Comment: Performed at [facility], [address], [place], [place] [address]  Procalcitonin     Status: None   Collection Time: 07/23/18  1:50 AM  Result Value Ref Range   Procalcitonin 0.93 ng/mL    Comment:        Interpretation: PCT > 0.5 ng/mL and <= 2 ng/mL: Systemic infection (sepsis) is possible, but other conditions are known to elevate PCT as well. (NOTE)       Sepsis PCT Algorithm           Lower Respiratory Tract                                      Infection PCT Algorithm    ----------------------------     ----------------------------         PCT < 0.25 ng/mL                PCT < 0.10 ng/mL         Strongly encourage             Strongly discourage   discontinuation of antibiotics    initiation  of antibiotics    ----------------------------     -----------------------------       PCT 0.25 - 0.50 ng/mL            PCT 0.10 - 0.25 ng/mL               OR       >80% decrease in PCT            Discourage initiation of                                            antibiotics      Encourage discontinuation           of antibiotics    ----------------------------     -----------------------------         PCT >= 0.50 ng/mL              PCT 0.26 - 0.50 ng/mL                AND       <80% decrease in PCT             Encourage initiation of                                             antibiotics       Encourage continuation           of antibiotics    ----------------------------     -----------------------------        PCT >= 0.50 ng/mL                  PCT > 0.50 ng/mL               AND         increase in PCT                  Strongly encourage                                      initiation of antibiotics    Strongly encourage escalation           of antibiotics                                     -----------------------------                                           PCT <= 0.25  ng/mL                                                 OR                                        >  80% decrease in PCT                                     Discontinue / Do not initiate                                             antibiotics Performed at [facility], [address], [place], [place] [address]   Sedimentation rate     Status: Abnormal   Collection Time: 07/23/18  1:50 AM  Result Value Ref Range   Sed Rate 73 (H) 0 - 16 mm/hr    Comment: Performed at [facility], [address] [place], [place] [address]  CBC     Status: Abnormal   Collection Time: 07/23/18  1:50 AM  Result Value Ref Range   WBC 27.5 (H) 4.0 - 10.5 K/uL    Comment: REPEATED TO VERIFY   RBC 3.56 (L) 4.22 - 5.81 MIL/uL   Hemoglobin 6.8 (LL) 13.0 - 17.0 g/dL    Comment: Reticulocyte Hemoglobin testing may be clinically indicated, consider ordering this additional test [id number] THIS CRITICAL RESULT HAS VERIFIED AND BEEN CALLED TO RN TERRI BY ALEXIS CRUICKSHANK ON 03 12 2020 AT 0223, AND HAS BEEN READ BACK.     HCT 24.7 (L) 39.0 - 52.0 %   MCV 69.4 (L) 80.0 - 100.0 fL   MCH 19.1 (L) 26.0 - 34.0 pg   MCHC 27.5 (L) 30.0 - 36.0 g/dL   RDW 46.9 (H) 62.9 - 52.8 %   Platelets 253 150 - 400 K/uL   nRBC 0.1 0.0 - 0.2 %    Comment: Performed at [facility], [address] [place], [place] [address]  Prepare RBC     Status: None   Collection Time: 07/23/18  1:50 AM  Result Value Ref Range   Order Confirmation      ORDER PROCESSED BY BLOOD BANK Performed at [facility], [address] [place], [place] [address]   CK     Status: Abnormal   Collection Time: 07/23/18  1:50 AM  Result Value Ref Range   Total CK 23 (L) 49 - 397 U/L    Comment: Performed at [facility], [address] [place], [place] [address]  Uric acid     Status: Abnormal   Collection Time: 07/23/18  1:50 AM  Result Value Ref Range    Uric Acid, Serum 2.7 (L) 3.7 - 8.6 mg/dL    Comment: Performed at [facility], [address], [place], [place] [address]  ABO/Rh     Status: None   Collection Time: 07/23/18  1:50 AM  Result Value Ref Range   ABO/RH(D)      A POS Performed at [facility], [address] [place], [place] [address]   Urine rapid drug screen (hosp performed)     Status: Abnormal   Collection Time: 07/23/18  1:52 AM  Result Value Ref Range   Opiates NONE DETECTED NONE DETECTED   Cocaine NONE DETECTED NONE DETECTED   Benzodiazepines NONE DETECTED NONE DETECTED   Amphetamines NONE DETECTED NONE DETECTED   Tetrahydrocannabinol POSITIVE (A) NONE DETECTED   Barbiturates NONE DETECTED NONE DETECTED    Comment: (NOTE) DRUG SCREEN FOR  MEDICAL PURPOSES ONLY.  IF CONFIRMATION IS NEEDED FOR ANY PURPOSE, NOTIFY LAB WITHIN 5 DAYS. LOWEST DETECTABLE LIMITS FOR URINE DRUG SCREEN Drug Class                     Cutoff (ng/mL) Amphetamine and metabolites    1000 Barbiturate and metabolites    200 Benzodiazepine                 200 Tricyclics and metabolites     300 Opiates and metabolites        300 Cocaine and metabolites        300 THC                            50 Performed at [facility], [address], [place], [place] [address]   Creatinine, urine, random     Status: None   Collection Time: 07/23/18  1:52 AM  Result Value Ref Range   Creatinine, Urine 337.94 mg/dL    Comment: Performed at [facility], [address] [place], [place] [address]  Sodium, urine, random     Status: None   Collection Time: 07/23/18  1:52 AM  Result Value Ref Range   Sodium, Ur 43 mmol/L    Comment: Performed at [facility], [address], [place], [place] [address]  Osmolality, urine     Status: Abnormal   Collection Time: 07/23/18  1:52 AM  Result Value Ref Range   Osmolality, Ur 915 (H) 300 - 900 mOsm/kg    Comment:  Performed at [facility], [address], [place], [place] [address]  Urinalysis, Routine w reflex microscopic     Status: Abnormal   Collection Time: 07/23/18  1:52 AM  Result Value Ref Range   Color, Urine AMBER (A) YELLOW    Comment: BIOCHEMICALS MAY BE AFFECTED BY COLOR   APPearance HAZY (A) CLEAR   Specific Gravity, Urine 1.032 (H) 1.005 - 1.030   pH 5.0 5.0 - 8.0   Glucose, UA NEGATIVE NEGATIVE mg/dL   Hgb urine dipstick NEGATIVE NEGATIVE   Bilirubin Urine NEGATIVE NEGATIVE   Ketones, ur 5 (A) NEGATIVE mg/dL   Protein, ur 295 (A) NEGATIVE mg/dL   Nitrite NEGATIVE NEGATIVE   Leukocytes,Ua NEGATIVE NEGATIVE   RBC / HPF 0-5 0 - 5 RBC/hpf   WBC, UA 6-10 0 - 5 WBC/hpf   Bacteria, UA NONE SEEN NONE SEEN   Squamous Epithelial / LPF 0-5 0 - 5   Mucus PRESENT     Comment: Performed at [facility], [address], [place], [place] [address]  Respiratory Panel by PCR     Status: None   Collection Time: 07/23/18 10:49 AM  Result Value Ref Range   Adenovirus NOT DETECTED NOT DETECTED   Coronavirus 229E NOT DETECTED NOT DETECTED    Comment: (NOTE) The Coronavirus on the Respiratory Panel, DOES NOT test for the novel  Coronavirus (2019 nCoV)    Coronavirus HKU1 NOT DETECTED NOT DETECTED   Coronavirus NL63 NOT DETECTED NOT DETECTED   Coronavirus OC43 NOT DETECTED NOT DETECTED   Metapneumovirus NOT DETECTED NOT DETECTED   Rhinovirus / Enterovirus NOT DETECTED NOT DETECTED   Influenza A NOT DETECTED NOT DETECTED   Influenza B NOT DETECTED NOT DETECTED   Parainfluenza Virus 1 NOT DETECTED NOT DETECTED   Parainfluenza Virus 2 NOT DETECTED NOT DETECTED  Parainfluenza Virus 3 NOT DETECTED NOT DETECTED   Parainfluenza Virus 4 NOT DETECTED NOT DETECTED   Respiratory Syncytial Virus NOT DETECTED NOT DETECTED   Bordetella pertussis NOT DETECTED NOT DETECTED   Chlamydophila pneumoniae NOT DETECTED NOT DETECTED   Mycoplasma pneumoniae NOT DETECTED NOT DETECTED     Comment: Performed at [facility], [address], [place], [place] [address]  C-reactive protein     Status: Abnormal   Collection Time: 07/23/18  3:26 PM  Result Value Ref Range   CRP 30.2 (H) <1.0 mg/dL    Comment: Performed at [facility], [address] [place], [place] [address]  Ferritin     Status: None   Collection Time: 07/23/18  3:26 PM  Result Value Ref Range   Ferritin 74 24 - 336 ng/mL    Comment: Performed at [facility], [address], [place], [place] [address]  Iron and TIBC     Status: Abnormal   Collection Time: 07/23/18  3:26 PM  Result Value Ref Range   Iron 13 (L) 45 - 182 ug/dL   TIBC 779 396 - 886 ug/dL   Saturation Ratios 4 (L) 17.9 - 39.5 %   UIBC 322 ug/dL    Comment: Performed at [facility], [address], [place], [place] [address]  CBC     Status: Abnormal   Collection Time: 07/23/18  3:26 PM  Result Value Ref Range   WBC 24.4 (H) 4.0 - 10.5 K/uL   RBC 4.12 (L) 4.22 - 5.81 MIL/uL   Hemoglobin 8.3 (L) 13.0 - 17.0 g/dL    Comment: Reticulocyte Hemoglobin testing may be clinically indicated, consider ordering this additional test [id number]    HCT 29.5 (L) 39.0 - 52.0 %   MCV 71.6 (L) 80.0 - 100.0 fL   MCH 20.1 (L) 26.0 - 34.0 pg   MCHC 28.1 (L) 30.0 - 36.0 g/dL   RDW 33.7 (H) 44.5 - 14.6 %   Platelets 287 150 - 400 K/uL   nRBC 0.0 0.0 - 0.2 %    Comment: Performed at [facility], [address], [place], [place] [address]  Prealbumin     Status: Abnormal   Collection Time: 07/23/18  4:08 PM  Result Value Ref Range   Prealbumin 7.7 (L) 18 - 38 mg/dL    Comment: Performed at [facility], [address] [place], [place] [address]  Magnesium     Status: None   Collection Time: 07/23/18  4:09 PM  Result Value Ref Range   Magnesium 2.2 1.7 - 2.4 mg/dL    Comment: Performed at [facility], [address],  [place], [place] [address]  Phosphorus     Status: None   Collection Time: 07/23/18  4:09 PM  Result Value Ref Range   Phosphorus 4.0 2.5 - 4.6 mg/dL    Comment: Performed at [facility], [address], [place], [place] [address]  Comprehensive metabolic panel     Status: Abnormal   Collection Time: 07/23/18  4:09 PM  Result Value Ref Range   Sodium 132 (L) 135 - 145 mmol/L   Potassium 4.0 3.5 - 5.1 mmol/L   Chloride 101 98 - 111 mmol/L   CO2 22 22 - 32 mmol/L   Glucose, Bld 96 70 - 99 mg/dL   BUN 12 6 - 20 mg/dL   Creatinine, Ser 9.27 0.61 - 1.24 mg/dL   Calcium  8.4 (L) 8.9 - 10.3 mg/dL   Total Protein 6.4 (L) 6.5 - 8.1 g/dL   Albumin 2.6 (L) 3.5 - 5.0 g/dL   AST 16 15 - 41 U/L   ALT 14 0 - 44 U/L   Alkaline Phosphatase 60 38 - 126 U/L   Total Bilirubin 1.0 0.3 - 1.2 mg/dL   GFR calc non Af Amer >60 >60 mL/min   GFR calc Af Amer >60 >60 mL/min   Anion gap 9 5 - 15    Comment: Performed at [facility], [address] [place], [place] [address]  TSH     Status: None   Collection Time: 07/23/18  4:10 PM  Result Value Ref Range   TSH 1.817 0.350 - 4.500 uIU/mL    Comment: Performed by a 3rd Generation assay with a functional sensitivity of <=0.01 uIU/mL. Performed at [facility], [address] [place], [place] [address]   Acetaminophen level     Status: Abnormal   Collection Time: 07/23/18  4:10 PM  Result Value Ref Range   Acetaminophen (Tylenol), Serum <10 (L) 10 - 30 ug/mL    Comment: (NOTE) Therapeutic concentrations vary significantly. A range of 10-30 ug/mL  may be an effective concentration for many patients. However, some  are best treated at concentrations outside of this range. Acetaminophen concentrations >150 ug/mL at 4 hours after ingestion  and >50 ug/mL at 12 hours after ingestion are often associated with  toxic reactions. Performed at [facility], [address], [place],  [place] [address]    Dg Chest 2 View  Result Date: 07/22/2018 CLINICAL DATA:  48 year old male with flu like symptoms. Body aches and pain. Smoker. EXAM: CHEST - 2 VIEW COMPARISON:  None. FINDINGS: Lung volumes at the upper limits of normal. Mediastinal contours are within normal limits. Visualized tracheal air column is within normal limits. No pneumothorax, pleural effusion or confluent pulmonary opacity. Mild diffuse increased pulmonary interstitial markings. No acute osseous abnormality identified. Negative visible bowel gas pattern. IMPRESSION: Mild diffuse increased pulmonary interstitial markings which could be smoking related, but consider viral/atypical respiratory infection in this clinical setting. No pleural effusion. Electronically Signed   By: Odessa Fleming M.D.   On: 07/22/2018 16:46   Dg Wrist Complete Right  Result Date: 07/22/2018 CLINICAL DATA:  48 year old male with flu like symptoms. Body aches. Pain. Fall several days ago with right upper extremity pain and swelling. EXAM: RIGHT WRIST - COMPLETE 3+ VIEW COMPARISON:  None. FINDINGS: Bone mineralization is within normal limits. Generalized soft tissue swelling. Distal radius and ulna are intact. Carpal bones appear intact and normally aligned. Chronic appearing degenerative spurring and mild fragmentation at the base of the 5th metacarpal. No acute fracture identified. IMPRESSION: Chronic appearing changes at the base of the 5th metacarpal. No acute fracture or dislocation identified. Electronically Signed   By: Odessa Fleming M.D.   On: 07/22/2018 16:48   Ct Head Wo Contrast  Result Date: 07/22/2018 CLINICAL DATA:  Pain across the neck and shoulders bilaterally. Hot and cold chills. EXAM: CT HEAD WITHOUT CONTRAST CT CERVICAL SPINE WITHOUT CONTRAST TECHNIQUE: Multidetector CT imaging of the head and cervical spine was performed following the standard protocol without intravenous contrast. Multiplanar CT image reconstructions of the cervical spine were  also generated. COMPARISON:  None. FINDINGS: CT HEAD FINDINGS Brain: No evidence of acute infarction, hemorrhage, hydrocephalus, extra-axial collection or mass lesion/mass effect. Vascular: No hyperdense vessel or unexpected calcification. Skull: Normal. Negative  for fracture or focal lesion. Sinuses/Orbits: No acute finding. Other: None. CT CERVICAL SPINE FINDINGS Alignment: Slight reversal cervical lordosis at C5-6 attributable to degenerative disc disease. Skull base and vertebrae: Intact skull base. Bone island noted of the C4 vertebral body on the left. No acute cervical spine fracture. Uncovertebral joint erosive osteoarthritis with subchondral erosive changes noted at C5-6 on the right. Jumped or perched facets. Soft tissues and spinal canal: No prevertebral fluid or swelling. No visible canal hematoma. Disc levels: Moderate disc flattening C5-6 with small posterior marginal osteophytes. No significant foraminal encroachment. Upper chest: Negative. Other: None IMPRESSION: 1. No acute intracranial abnormality. 2. 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 No acute cervical spine fracture. Electronically Signed   By: Tollie Eth M.D.   On: 07/22/2018 20:14   Ct Chest Wo Contrast  Result Date: 07/23/2018 CLINICAL DATA:  General body aches, pain across the shoulders for 5 days. Larey Seat several days ago. EXAM: CT CHEST WITHOUT CONTRAST TECHNIQUE: Multidetector CT imaging of the chest was performed following the standard protocol without IV contrast. COMPARISON:  None. FINDINGS: Cardiovascular: Evaluation of vascular structures is limited without IV contrast material. Normal heart size. No pericardial effusion. Scattered coronary artery calcifications. Normal caliber thoracic aorta. Scattered aortic calcification. Mediastinum/Nodes: Esophagus is decompressed. No significant mediastinal  lymphadenopathy. Moderately prominent axillary lymph nodes without pathologic enlargement, likely reactive. Lungs/Pleura: Emphysematous changes in the lungs. Mild dependent atelectasis. No airspace disease or consolidation is suggested. No pleural effusions. No pneumothorax. Airways are patent. Upper Abdomen: No acute abnormalities. Musculoskeletal: Normal alignment of the thoracic spine. No destructive bone lesions. IMPRESSION: No evidence of active pulmonary disease. Emphysematous changes in the lungs. Aortic Atherosclerosis (ICD10-I70.0) and Emphysema (ICD10-J43.9). Electronically Signed   By: Burman Nieves M.D.   On: 07/23/2018 01:01   Ct Cervical Spine Wo Contrast  Result Date: 07/22/2018 CLINICAL DATA:  Pain across the neck and shoulders bilaterally. Hot and cold chills. EXAM: CT HEAD WITHOUT CONTRAST CT CERVICAL SPINE WITHOUT CONTRAST TECHNIQUE: Multidetector CT imaging of the head and cervical spine was performed following the standard protocol without intravenous contrast. Multiplanar CT image reconstructions of the cervical spine were also generated. COMPARISON:  None. FINDINGS: CT HEAD FINDINGS Brain: No evidence of acute infarction, hemorrhage, hydrocephalus, extra-axial collection or mass lesion/mass effect. Vascular: No hyperdense vessel or unexpected calcification. Skull: Normal. Negative for fracture or focal lesion. Sinuses/Orbits: No acute finding. Other: None. CT CERVICAL SPINE FINDINGS Alignment: Slight reversal cervical lordosis at C5-6 attributable to degenerative disc disease. Skull base and vertebrae: Intact skull base. Bone island noted of the C4 vertebral body on the left. No acute cervical spine fracture. Uncovertebral joint erosive osteoarthritis with subchondral erosive changes noted at C5-6 on the right. Jumped or perched facets. Soft tissues and spinal canal: No prevertebral fluid or swelling. No visible canal hematoma. Disc levels: Moderate disc flattening C5-6 with small  posterior marginal osteophytes. No significant foraminal encroachment. Upper chest: Negative. Other: None IMPRESSION: 1. No acute intracranial abnormality. 2. 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 No acute cervical spine fracture. Electronically Signed   By: Tollie Eth M.D.   On: 07/22/2018 20:14   Dg Hand Complete Right  Result Date: 07/22/2018 CLINICAL DATA:  Initial evaluation for acute general right hand pain with swelling. EXAM: RIGHT HAND - COMPLETE 3+ VIEW COMPARISON:  None. FINDINGS: No acute fracture or dislocation. Chronic changes at the base of the right fifth metacarpal. Joint spaces otherwise maintained without evidence for significant degenerative or erosive arthropathy. No discrete osseous lesions. No soft tissue abnormality. IMPRESSION: 1. No acute osseous abnormality. 2. Chronic changes at the base of the right fifth metacarpal. Electronically Signed   By: Rise Mu M.D.   On: 07/22/2018 20:04   Dg Foot Complete Left  Result Date: 07/22/2018 CLINICAL DATA:  Initial evaluation for acute generalized left foot pain with swelling. EXAM: LEFT FOOT - COMPLETE 3+ VIEW COMPARISON:  None. FINDINGS: Chronic changes at the medial tarsometatarsal articulations. Joint spaces otherwise maintained without evidence for significant degenerative or erosive arthropathy. No acute fracture dislocation. No discrete osseous lesions. No soft tissue abnormality. IMPRESSION: 1. No acute osseous abnormality. 2. Chronic changes at the medial tarsometatarsal articulations. Electronically Signed   By: Rise Mu M.D.   On: 07/22/2018 20:07    Pending Labs Unresulted Labs (From admission, onward)    Start     Ordered   07/24/18 0500  Basic metabolic panel  Tomorrow morning,   R     07/23/18 1612   07/24/18 0500  CBC  Tomorrow morning,   R     07/23/18 1612    07/23/18 1532  HIV antibody (Routine Testing)  Tomorrow morning,   R     07/23/18 1531   07/22/18 2341  Culture, blood (x 2)  BLOOD CULTURE X 2,   STAT    Comments:  INITIATE ANTIBIOTICS WITHIN 1 HOUR AFTER BLOOD CULTURES DRAWN.  If unable to obtain blood cultures, call MD immediately regarding antibiotic instructions.    07/22/18 2343   07/22/18 2341  Lactic acid, plasma  STAT Now then every 3 hours,   STAT     07/22/18 2343   07/22/18 1933  Ethanol  ONCE - STAT,   STAT     07/22/18 1932          Vitals/Pain Today's Vitals   07/23/18 1701 07/23/18 1830 07/23/18 1900 07/23/18 2100  BP:  135/86 135/90 120/77  Pulse:  95 99 86  Resp:  Temp: 99.5 F (37.5 C)     TempSrc: Oral     SpO2:  98% 99% 96%  Weight:      Height:      PainSc:        Isolation Precautions Droplet precaution  Medications Medications  sodium chloride flush (NS) 0.9 % injection 3 mL (has no administration in time range)  ondansetron (ZOFRAN) tablet 4 mg (has no administration in time range)    Or  ondansetron (ZOFRAN) injection 4 mg (has no administration in time range)  0.9 %  sodium chloride infusion ( Intravenous New Bag/Given 07/23/18 1559)  levalbuterol (XOPENEX) nebulizer solution 0.63 mg (has no administration in time range)  0.9 %  sodium chloride infusion (Manually program via Guardrails IV Fluids) (has no administration in time range)  pantoprazole (PROTONIX) injection 40 mg (40 mg Intravenous Given 07/23/18 1108)  vancomycin (VANCOCIN) 1,500 mg in sodium chloride 0.9 % 500 mL IVPB (0 mg Intravenous Stopped 07/23/18 1158)  lidocaine (PF) (XYLOCAINE) 1 % injection (has no administration in time range)  Influenza vac split quadrivalent PF (FLUARIX) injection 0.5 mL (has no administration in time range)  morphine 2 MG/ML injection 1-2 mg (2 mg Intravenous Given 07/23/18 1623)  colchicine tablet 0.6 mg (0.6 mg Oral Given 07/23/18 1718)  ibuprofen (ADVIL,MOTRIN) tablet 800 mg (  800 mg Oral  Given 07/23/18 1943)  sodium chloride 0.9 % 1,000 mL with thiamine 100 mg, folic acid 1 mg, multivitamins adult 10 mL infusion ( Intravenous Stopped 07/23/18 0304)  pantoprazole (PROTONIX) injection 40 mg (40 mg Intravenous Given 07/22/18 2323)  morphine 4 MG/ML injection 4 mg (4 mg Intravenous Given 07/22/18 2319)  LORazepam (ATIVAN) injection 1 mg (1 mg Intravenous Given 07/22/18 2317)  sodium chloride 0.9 % bolus 1,000 mL (0 mLs Intravenous Stopped 07/23/18 0114)  vancomycin (VANCOCIN) 1,500 mg in sodium chloride 0.9 % 500 mL IVPB (0 mg Intravenous Stopped 07/23/18 0323)  acetaminophen (TYLENOL) tablet 650 mg (650 mg Oral Given 07/23/18 1059)    Mobility non-ambulatory Low fall risk   Focused Assessments cardiac   R Recommendations: See Admitting Provider Note  Report given to:   Additional Notes:

## 2018-07-23 NOTE — ED Notes (Signed)
Vancomycin given and recorded on paper chart during downtime

## 2018-07-23 NOTE — Progress Notes (Signed)
PROGRESS NOTE    Dillon Schaefer  ZOX:[id number] DOB: 08/25/1970 DOA: 07/22/2018 PCP: Patient, No Pcp Per   Brief Narrative:  Dillon Schaefer is a 48 y.o. male with medical history significant of  Duodenitis, EtOh abuse, Presented with   4 to 5-day history of pain across neck and shoulders with hot and cold chills at home with generalized body aches.he was also found to be anemic and required 1 unit of prbc transfusion.     Assessment & Plan:   Active Problems:   Alcohol abuse   Abnormal CXR   Dehydration   Melena   Symptomatic anemia   Fall at home, initial encounter   Leg swelling   Upper GI bleed   Hyponatremia   SIRS (systemic inflammatory response syndrome) (HCC)   Pica  SIRS/ Fever, leukocytosis and tachycardia, elevated lactic acidosis.  Probably secondary to cellulitis from the left ankle.  Check respiratory panel. HIV status pending.  Blood cultures negative so far.  Urine cultures negative.  Continue with IV vancomycin empirically.    Alcohol abuse:  - CIWA protocol.    Acute anemia of blood loss probably GI source with his history of duodenitis:  - s/p 1 unit of prbc transfusion.  - repeat H&H in am.  - GI consulted and plan for EGD and colonoscopy in am.  - iv PPI .     Multiple joint pain and swelling.   Differential include GOUT vs septic arthritis.  Appreciate orthopedics recommendations.  Monitor on colchicine.    Hyponatremia:  Repeat in am.    H/o of PICA:  Nutrition consult.     DVT prophylaxis: SCD'S Code Status:  Full code.  Family Communication: none at bedside.  Disposition Plan: pending clinical improvement.    Consultants:   Orthopedics  Gastroenterology.   Procedures: None.  Antimicrobials: vancomycin for cellulitis   Subjective: Reports pain is worse in the left ankle, no chest pain or sob.   Objective: Vitals:   07/23/18 1300 07/23/18 1330 07/23/18 1400 07/23/18 1500  BP: 119/83 120/83 125/83 (!) 143/92    Pulse: 80 78 90 97  Resp: Temp:      TempSrc:      SpO2: 94% 99% 99% 98%  Weight:      Height:        Intake/Output Summary (Last 24 hours) at 07/23/2018 1613 Last data filed at 07/23/2018 1059 Gross per 24 hour  Intake 638 ml  Output --  Net 638 ml   Filed Weights   07/22/18 1524  Weight: 71.2 kg    Examination:  General exam: mild distress from pain in the joints Respiratory system: Clear to auscultation. Respiratory effort normal. Cardiovascular system: S1 & S2 heard, tachycardic.  Gastrointestinal system: Abdomen is soft NT nd bs+ Central nervous system: Alert and oriented. No focal neurological deficits. Extremities:  Left ankle swelling ,  Left wrist and left metacarpophalangeal joint swollen and tender.  Skin: No rashes, lesions or ulcers Psychiatry: Mood & affect appropriate.     Data Reviewed: I have personally reviewed following labs and imaging studies  CBC: Recent Labs  Lab 07/22/18 1526 07/23/18 0150  WBC 28.7* 27.5*  NEUTROABS 24.4*  --   HGB 7.3* 6.8*  HCT 26.3* 24.7*  MCV 68.1* 69.4*  PLT 259 253   Basic Metabolic Panel: Recent Labs  Lab 07/22/18 1526  NA 131*  K 4.0  CL 96*  CO2 24  GLUCOSE 116*  BUN 18  CREATININE  0.73  CALCIUM 8.7*   GFR: Estimated Creatinine Clearance: 115 mL/min (by C-G formula based on SCr of 0.73 mg/dL). Liver Function Tests: Recent Labs  Lab 07/22/18 1526  AST 17  ALT 16  ALKPHOS 63  BILITOT 0.5  PROT 6.9  ALBUMIN 2.9*   No results for input(s): LIPASE, AMYLASE in the last 168 hours. No results for input(s): AMMONIA in the last 168 hours. Coagulation Profile: Recent Labs  Lab 07/23/18 0150  INR 1.0   Cardiac Enzymes: Recent Labs  Lab 07/23/18 0150  CKTOTAL 23*  TROPONINI <0.03   BNP (last 3 results) No results for input(s): PROBNP in the last 8760 hours. HbA1C: No results for input(s): HGBA1C in the last 72 hours. CBG: No results for input(s): GLUCAP in the last 168  hours. Lipid Profile: No results for input(s): CHOL, HDL, LDLCALC, TRIG, CHOLHDL, LDLDIRECT in the last 72 hours. Thyroid Function Tests: No results for input(s): TSH, T4TOTAL, FREET4, T3FREE, THYROIDAB in the last 72 hours. Anemia Panel: No results for input(s): VITAMINB12, FOLATE, FERRITIN, TIBC, IRON, RETICCTPCT in the last 72 hours. Sepsis Labs: Recent Labs  Lab 07/22/18 1526 07/23/18 0150  PROCALCITON  --  0.93  LATICACIDVEN 1.5 0.8    Recent Results (from the past 240 hour(s))  MRSA PCR Screening     Status: None   Collection Time: 07/23/18  1:14 AM  Result Value Ref Range Status   MRSA by PCR NEGATIVE NEGATIVE Final    Comment:        The GeneXpert MRSA Assay (FDA approved for NASAL specimens only), is one component of a comprehensive MRSA colonization surveillance program. It is not intended to diagnose MRSA infection nor to guide or monitor treatment for MRSA infections. Performed at [facility], [address] [place], [place] [address]          Radiology Studies: Dg Chest 2 View  Result Date: 07/22/2018 CLINICAL DATA:  48 year old male with flu like symptoms. Body aches and pain. Smoker. EXAM: CHEST - 2 VIEW COMPARISON:  None. FINDINGS: Lung volumes at the upper limits of normal. Mediastinal contours are within normal limits. Visualized tracheal air column is within normal limits. No pneumothorax, pleural effusion or confluent pulmonary opacity. Mild diffuse increased pulmonary interstitial markings. No acute osseous abnormality identified. Negative visible bowel gas pattern. IMPRESSION: Mild diffuse increased pulmonary interstitial markings which could be smoking related, but consider viral/atypical respiratory infection in this clinical setting. No pleural effusion. Electronically Signed   By: Odessa Fleming M.D.   On: 07/22/2018 16:46   Dg Wrist Complete Right  Result Date: 07/22/2018 CLINICAL DATA:  48 year old male with flu like symptoms.  Body aches. Pain. Fall several days ago with right upper extremity pain and swelling. EXAM: RIGHT WRIST - COMPLETE 3+ VIEW COMPARISON:  None. FINDINGS: Bone mineralization is within normal limits. Generalized soft tissue swelling. Distal radius and ulna are intact. Carpal bones appear intact and normally aligned. Chronic appearing degenerative spurring and mild fragmentation at the base of the 5th metacarpal. No acute fracture identified. IMPRESSION: Chronic appearing changes at the base of the 5th metacarpal. No acute fracture or dislocation identified. Electronically Signed   By: Odessa Fleming M.D.   On: 07/22/2018 16:48   Ct Head Wo Contrast  Result Date: 07/22/2018 CLINICAL DATA:  Pain across the neck and shoulders bilaterally. Hot and cold chills. EXAM: CT HEAD WITHOUT CONTRAST CT CERVICAL SPINE WITHOUT CONTRAST TECHNIQUE: Multidetector CT imaging of the head and cervical spine was performed  following the standard protocol without intravenous contrast. Multiplanar CT image reconstructions of the cervical spine were also generated. COMPARISON:  None. FINDINGS: CT HEAD FINDINGS Brain: No evidence of acute infarction, hemorrhage, hydrocephalus, extra-axial collection or mass lesion/mass effect. Vascular: No hyperdense vessel or unexpected calcification. Skull: Normal. Negative for fracture or focal lesion. Sinuses/Orbits: No acute finding. Other: None. CT CERVICAL SPINE FINDINGS Alignment: Slight reversal cervical lordosis at C5-6 attributable to degenerative disc disease. Skull base and vertebrae: Intact skull base. Bone island noted of the C4 vertebral body on the left. No acute cervical spine fracture. Uncovertebral joint erosive osteoarthritis with subchondral erosive changes noted at C5-6 on the right. Jumped or perched facets. Soft tissues and spinal canal: No prevertebral fluid or swelling. No visible canal hematoma. Disc levels: Moderate disc flattening C5-6 with small posterior marginal osteophytes. No  significant foraminal encroachment. Upper chest: Negative. Other: None IMPRESSION: 1. No acute intracranial abnormality. 2. 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 No acute cervical spine fracture. Electronically Signed   By: Tollie Ethavid  Kwon M.D.   On: 07/22/2018 20:14   Ct Chest Wo Contrast  Result Date: 07/23/2018 CLINICAL DATA:  General body aches, pain across the shoulders for 5 days. Larey SeatFell several days ago. EXAM: CT CHEST WITHOUT CONTRAST TECHNIQUE: Multidetector CT imaging of the chest was performed following the standard protocol without IV contrast. COMPARISON:  None. FINDINGS: Cardiovascular: Evaluation of vascular structures is limited without IV contrast material. Normal heart size. No pericardial effusion. Scattered coronary artery calcifications. Normal caliber thoracic aorta. Scattered aortic calcification. Mediastinum/Nodes: Esophagus is decompressed. No significant mediastinal lymphadenopathy. Moderately prominent axillary lymph nodes without pathologic enlargement, likely reactive. Lungs/Pleura: Emphysematous changes in the lungs. Mild dependent atelectasis. No airspace disease or consolidation is suggested. No pleural effusions. No pneumothorax. Airways are patent. Upper Abdomen: No acute abnormalities. Musculoskeletal: Normal alignment of the thoracic spine. No destructive bone lesions. IMPRESSION: No evidence of active pulmonary disease. Emphysematous changes in the lungs. Aortic Atherosclerosis (ICD10-I70.0) and Emphysema (ICD10-J43.9). Electronically Signed   By: Burman NievesWilliam  Stevens M.D.   On: 07/23/2018 01:01   Ct Cervical Spine Wo Contrast  Result Date: 07/22/2018 CLINICAL DATA:  Pain across the neck and shoulders bilaterally. Hot and cold chills. EXAM: CT HEAD WITHOUT CONTRAST CT CERVICAL SPINE WITHOUT CONTRAST TECHNIQUE: Multidetector CT imaging of the head and  cervical spine was performed following the standard protocol without intravenous contrast. Multiplanar CT image reconstructions of the cervical spine were also generated. COMPARISON:  None. FINDINGS: CT HEAD FINDINGS Brain: No evidence of acute infarction, hemorrhage, hydrocephalus, extra-axial collection or mass lesion/mass effect. Vascular: No hyperdense vessel or unexpected calcification. Skull: Normal. Negative for fracture or focal lesion. Sinuses/Orbits: No acute finding. Other: None. CT CERVICAL SPINE FINDINGS Alignment: Slight reversal cervical lordosis at C5-6 attributable to degenerative disc disease. Skull base and vertebrae: Intact skull base. Bone island noted of the C4 vertebral body on the left. No acute cervical spine fracture. Uncovertebral joint erosive osteoarthritis with subchondral erosive changes noted at C5-6 on the right. Jumped or perched facets. Soft tissues and spinal canal: No prevertebral fluid or swelling. No visible canal hematoma. Disc levels: Moderate disc flattening C5-6 with small posterior marginal osteophytes. No significant foraminal encroachment. Upper chest: Negative. Other: None IMPRESSION: 1. No acute intracranial abnormality. 2. 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 No acute  cervical spine fracture. Electronically Signed   By: Tollie Eth M.D.   On: 07/22/2018 20:14   Dg Hand Complete Right  Result Date: 07/22/2018 CLINICAL DATA:  Initial evaluation for acute general right hand pain with swelling. EXAM: RIGHT HAND - COMPLETE 3+ VIEW COMPARISON:  None. FINDINGS: No acute fracture or dislocation. Chronic changes at the base of the right fifth metacarpal. Joint spaces otherwise maintained without evidence for significant degenerative or erosive arthropathy. No discrete osseous lesions. No soft tissue abnormality. IMPRESSION: 1. No acute osseous  abnormality. 2. Chronic changes at the base of the right fifth metacarpal. Electronically Signed   By: Rise Mu M.D.   On: 07/22/2018 20:04   Dg Foot Complete Left  Result Date: 07/22/2018 CLINICAL DATA:  Initial evaluation for acute generalized left foot pain with swelling. EXAM: LEFT FOOT - COMPLETE 3+ VIEW COMPARISON:  None. FINDINGS: Chronic changes at the medial tarsometatarsal articulations. Joint spaces otherwise maintained without evidence for significant degenerative or erosive arthropathy. No acute fracture dislocation. No discrete osseous lesions. No soft tissue abnormality. IMPRESSION: 1. No acute osseous abnormality. 2. Chronic changes at the medial tarsometatarsal articulations. Electronically Signed   By: Rise Mu M.D.   On: 07/22/2018 20:07        Scheduled Meds:  sodium chloride   Intravenous Once   colchicine  0.6 mg Oral BID   [START ON 07/24/2018] Influenza vac split quadrivalent PF  0.5 mL Intramuscular Tomorrow-1000   lidocaine (PF)       pantoprazole (PROTONIX) IV  40 mg Intravenous Q12H   sodium chloride flush  3 mL Intravenous Once   Continuous Infusions:  sodium chloride 100 mL/hr at 07/23/18 1559   vancomycin Stopped (07/23/18 1158)     LOS: 0 days    Time spent: 42 minutes.     Kathlen Mody, MD [facility] Pager [phone]   If 7PM-7AM, please contact night-coverage [email or web address] Password [facility] 07/23/2018, 4:13 PM

## 2018-07-23 NOTE — Consult Note (Signed)
Reason for Consult: Right hand pain and swelling, left ankle pain and swelling  Referring Physician: [name] is an 48 y.o. male.  HPI: 48 year old male who lives in a boardinghouse with complex medical presentation with high white blood cell count and anemia with multiple joint complaints.  He complains of some diffuse body pains including his shoulders arms and legs with notable swelling in the dorsum of the right hand and pain in mild swelling of the left ankle.  He notes some associated pain in the right calf and left hand as well.  Symptoms have been worsening in the last 24 hours.  Denies history of gout.  History reviewed. No pertinent past medical history.  History reviewed. No pertinent surgical history.  Family History  Problem Relation Age of Onset  . Diabetes Other     Social History:  reports that he has been smoking. He has been smoking about 0.50 packs per day. He does not have any smokeless tobacco history on file. He reports current alcohol use. He reports current drug use. Frequency: 7.00 times per week. Drug: Marijuana.  Allergies: No Known Allergies  Medications: I have reviewed the patient's current medications.  Results for orders placed or performed during the hospital encounter of 07/22/18 (from the past 48 hour(s))  Lactic acid, plasma     Status: None   Collection Time: 07/22/18  3:26 PM  Result Value Ref Range   Lactic Acid, Venous 1.5 0.5 - 1.9 mmol/L    Comment: Performed at [facility], [address] [place], [place] [address]  Comprehensive metabolic panel     Status: Abnormal   Collection Time: 07/22/18  3:26 PM  Result Value Ref Range   Sodium 131 (L) 135 - 145 mmol/L   Potassium 4.0 3.5 - 5.1 mmol/L   Chloride 96 (L) 98 - 111 mmol/L   CO2 24 22 - 32 mmol/L   Glucose, Bld 116 (H) 70 - 99 mg/dL   BUN 18 6 - 20 mg/dL   Creatinine, Ser 0.35 0.61 - 1.24 mg/dL   Calcium 8.7 (L) 8.9 - 10.3 mg/dL   Total Protein 6.9  6.5 - 8.1 g/dL   Albumin 2.9 (L) 3.5 - 5.0 g/dL   AST 17 15 - 41 U/L   ALT 16 0 - 44 U/L   Alkaline Phosphatase 63 38 - 126 U/L   Total Bilirubin 0.5 0.3 - 1.2 mg/dL   GFR calc non Af Amer >60 >60 mL/min   GFR calc Af Amer >60 >60 mL/min   Anion gap 11 5 - 15    Comment: Performed at [facility], [address], [place], [place] [address]  CBC with Differential     Status: Abnormal   Collection Time: 07/22/18  3:26 PM  Result Value Ref Range   WBC 28.7 (H) 4.0 - 10.5 K/uL   RBC 3.86 (L) 4.22 - 5.81 MIL/uL   Hemoglobin 7.3 (L) 13.0 - 17.0 g/dL    Comment: Reticulocyte Hemoglobin testing may be clinically indicated, consider ordering this additional test [id number]    HCT 26.3 (L) 39.0 - 52.0 %   MCV 68.1 (L) 80.0 - 100.0 fL   MCH 18.9 (L) 26.0 - 34.0 pg   MCHC 27.8 (L) 30.0 - 36.0 g/dL   RDW 68.0 (H) 32.1 - 22.4 %   Platelets 259 150 - 400 K/uL   nRBC 0.1 0.0 - 0.2 %   Neutrophils Relative % 84 %  Neutro Abs 24.4 (H) 1.7 - 7.7 K/uL   Lymphocytes Relative 4 %   Lymphs Abs 1.0 0.7 - 4.0 K/uL   Monocytes Relative 10 %   Monocytes Absolute 2.8 (H) 0.1 - 1.0 K/uL   Eosinophils Relative 0 %   Eosinophils Absolute 0.0 0.0 - 0.5 K/uL   Basophils Relative 0 %   Basophils Absolute 0.1 0.0 - 0.1 K/uL   Immature Granulocytes 2 %   Abs Immature Granulocytes 0.44 (H) 0.00 - 0.07 K/uL   Dohle Bodies PRESENT    Target Cells PRESENT    Ovalocytes PRESENT     Comment: Performed at [facility], [address] [place], [place] [address]  Urinalysis, Routine w reflex microscopic     Status: Abnormal   Collection Time: 07/22/18  3:26 PM  Result Value Ref Range   Color, Urine YELLOW YELLOW   APPearance HAZY (A) CLEAR   Specific Gravity, Urine 1.032 (H) 1.005 - 1.030   pH 5.0 5.0 - 8.0   Glucose, UA NEGATIVE NEGATIVE mg/dL   Hgb urine dipstick NEGATIVE NEGATIVE   Bilirubin Urine NEGATIVE NEGATIVE   Ketones, ur 5 (A) NEGATIVE mg/dL   Protein, ur  604 (A) NEGATIVE mg/dL   Nitrite NEGATIVE NEGATIVE   Leukocytes,Ua NEGATIVE NEGATIVE   RBC / HPF 0-5 0 - 5 RBC/hpf   WBC, UA 11-20 0 - 5 WBC/hpf   Bacteria, UA NONE SEEN NONE SEEN   Squamous Epithelial / LPF 0-5 0 - 5   Mucus PRESENT    Hyaline Casts, UA PRESENT     Comment: Performed at [facility], [address] [place], [place] [address]  Occult blood, poc device     Status: Abnormal   Collection Time: 07/22/18 10:11 PM  Result Value Ref Range   Fecal Occult Bld POSITIVE (A) NEGATIVE  Type and screen [facility]     Status: None (Preliminary result)   Collection Time: 07/23/18  1:50 AM  Result Value Ref Range   ABO/RH(D) A POS    Antibody Screen NEG    Sample Expiration 07/26/2018    Unit Number [id number]    Blood Component Type RED CELLS,LR    Unit division 00    Status of Unit ISSUED    Transfusion Status OK TO TRANSFUSE    Crossmatch Result      Compatible Performed at [facility], [address], [place], [place] [address]   Protime-INR     Status: None   Collection Time: 07/23/18  1:50 AM  Result Value Ref Range   Prothrombin Time 13.4 11.4 - 15.2 seconds   INR 1.0 0.8 - 1.2    Comment: (NOTE) INR goal varies based on device and disease states. Performed at [facility], [address], [place], [place] [address]   Troponin I - Add-On to previous collection     Status: None   Collection Time: 07/23/18  1:50 AM  Result Value Ref Range   Troponin I <0.03 <0.03 ng/mL    Comment: Performed at [facility], [address] [place], [place] [address]  Lactic acid, plasma     Status: None   Collection Time: 07/23/18  1:50 AM  Result Value Ref Range   Lactic Acid, Venous 0.8 0.5 - 1.9 mmol/L    Comment: Performed at [facility], [address] [place], [place] [address]  Procalcitonin     Status: None  Collection Time: 07/23/18  1:50 AM   Result Value Ref Range   Procalcitonin 0.93 ng/mL    Comment:        Interpretation: PCT > 0.5 ng/mL and <= 2 ng/mL: Systemic infection (sepsis) is possible, but other conditions are known to elevate PCT as well. (NOTE)       Sepsis PCT Algorithm           Lower Respiratory Tract                                      Infection PCT Algorithm    ----------------------------     ----------------------------         PCT < 0.25 ng/mL                PCT < 0.10 ng/mL         Strongly encourage             Strongly discourage   discontinuation of antibiotics    initiation of antibiotics    ----------------------------     -----------------------------       PCT 0.25 - 0.50 ng/mL            PCT 0.10 - 0.25 ng/mL               OR       >80% decrease in PCT            Discourage initiation of                                            antibiotics      Encourage discontinuation           of antibiotics    ----------------------------     -----------------------------         PCT >= 0.50 ng/mL              PCT 0.26 - 0.50 ng/mL                AND       <80% decrease in PCT             Encourage initiation of                                             antibiotics       Encourage continuation           of antibiotics    ----------------------------     -----------------------------        PCT >= 0.50 ng/mL                  PCT > 0.50 ng/mL               AND         increase in PCT                  Strongly encourage                                      initiation of antibiotics  Strongly encourage escalation           of antibiotics                                     -----------------------------                                           PCT <= 0.25 ng/mL                                                 OR                                        > 80% decrease in PCT                                     Discontinue / Do not initiate                                              antibiotics Performed at [facility], [address] [place], [place] [address]   Sedimentation rate     Status: Abnormal   Collection Time: 07/23/18  1:50 AM  Result Value Ref Range   Sed Rate 73 (H) 0 - 16 mm/hr    Comment: Performed at [facility], [address] [place], [place] [address]  CBC     Status: Abnormal   Collection Time: 07/23/18  1:50 AM  Result Value Ref Range   WBC 27.5 (H) 4.0 - 10.5 K/uL    Comment: REPEATED TO VERIFY   RBC 3.56 (L) 4.22 - 5.81 MIL/uL   Hemoglobin 6.8 (LL) 13.0 - 17.0 g/dL    Comment: Reticulocyte Hemoglobin testing may be clinically indicated, consider ordering this additional test [id number] THIS CRITICAL RESULT HAS VERIFIED AND BEEN CALLED TO RN TERRI BY ALEXIS CRUICKSHANK ON 03 12 2020 AT 0223, AND HAS BEEN READ BACK.     HCT 24.7 (L) 39.0 - 52.0 %   MCV 69.4 (L) 80.0 - 100.0 fL   MCH 19.1 (L) 26.0 - 34.0 pg   MCHC 27.5 (L) 30.0 - 36.0 g/dL   RDW 13.0 (H) 86.5 - 78.4 %   Platelets 253 150 - 400 K/uL   nRBC 0.1 0.0 - 0.2 %    Comment: Performed at [facility], [address] [place], [place] [address]  Prepare RBC     Status: None   Collection Time: 07/23/18  1:50 AM  Result Value Ref Range   Order Confirmation      ORDER PROCESSED BY BLOOD BANK Performed at [facility], [address] [place], [place] [address]   CK     Status: Abnormal   Collection Time: 07/23/18  1:50 AM  Result Value Ref Range   Total CK 23 (L) 49 - 397 U/L  Comment: Performed at [facility], [address], [place], [place] [address]  Uric acid     Status: Abnormal   Collection Time: 07/23/18  1:50 AM  Result Value Ref Range   Uric Acid, Serum 2.7 (L) 3.7 - 8.6 mg/dL    Comment: Performed at [facility], [address] [place], [place] [address]  ABO/Rh     Status: None   Collection Time: 07/23/18  1:50 AM  Result Value Ref Range    ABO/RH(D)      A POS Performed at [facility], [address] [place], [place] [address]   Urine rapid drug screen (hosp performed)     Status: Abnormal   Collection Time: 07/23/18  1:52 AM  Result Value Ref Range   Opiates NONE DETECTED NONE DETECTED   Cocaine NONE DETECTED NONE DETECTED   Benzodiazepines NONE DETECTED NONE DETECTED   Amphetamines NONE DETECTED NONE DETECTED   Tetrahydrocannabinol POSITIVE (A) NONE DETECTED   Barbiturates NONE DETECTED NONE DETECTED    Comment: (NOTE) DRUG SCREEN FOR MEDICAL PURPOSES ONLY.  IF CONFIRMATION IS NEEDED FOR ANY PURPOSE, NOTIFY LAB WITHIN 5 DAYS. LOWEST DETECTABLE LIMITS FOR URINE DRUG SCREEN Drug Class                     Cutoff (ng/mL) Amphetamine and metabolites    1000 Barbiturate and metabolites    200 Benzodiazepine                 200 Tricyclics and metabolites     300 Opiates and metabolites        300 Cocaine and metabolites        300 THC                            50 Performed at [facility], [address], [place], [place] [address]   Creatinine, urine, random     Status: None   Collection Time: 07/23/18  1:52 AM  Result Value Ref Range   Creatinine, Urine 337.94 mg/dL    Comment: Performed at [facility], [address] [place], [place] [address]  Sodium, urine, random     Status: None   Collection Time: 07/23/18  1:52 AM  Result Value Ref Range   Sodium, Ur 43 mmol/L    Comment: Performed at [facility], [address] [place], [place] [address]  Urinalysis, Routine w reflex microscopic     Status: Abnormal   Collection Time: 07/23/18  1:52 AM  Result Value Ref Range   Color, Urine AMBER (A) YELLOW    Comment: BIOCHEMICALS MAY BE AFFECTED BY COLOR   APPearance HAZY (A) CLEAR   Specific Gravity, Urine 1.032 (H) 1.005 - 1.030   pH 5.0 5.0 - 8.0   Glucose, UA NEGATIVE NEGATIVE mg/dL   Hgb urine dipstick NEGATIVE NEGATIVE    Bilirubin Urine NEGATIVE NEGATIVE   Ketones, ur 5 (A) NEGATIVE mg/dL   Protein, ur 315 (A) NEGATIVE mg/dL   Nitrite NEGATIVE NEGATIVE   Leukocytes,Ua NEGATIVE NEGATIVE   RBC / HPF 0-5 0 - 5 RBC/hpf   WBC, UA 6-10 0 - 5 WBC/hpf   Bacteria, UA NONE SEEN NONE SEEN   Squamous Epithelial / LPF 0-5 0 - 5   Mucus PRESENT     Comment: Performed at [facility], [address] [place], [place]  [address]    Dg Chest 2 View  Result Date: 07/22/2018 CLINICAL DATA:  48 year old male with flu like symptoms. Body aches and pain. Smoker. EXAM: CHEST - 2 VIEW COMPARISON:  None. FINDINGS: Lung volumes at the upper limits of normal. Mediastinal contours are within normal limits. Visualized tracheal air column is within normal limits. No pneumothorax, pleural effusion or confluent pulmonary opacity. Mild diffuse increased pulmonary interstitial markings. No acute osseous abnormality identified. Negative visible bowel gas pattern. IMPRESSION: Mild diffuse increased pulmonary interstitial markings which could be smoking related, but consider viral/atypical respiratory infection in this clinical setting. No pleural effusion. Electronically Signed   By: Odessa Fleming M.D.   On: 07/22/2018 16:46   Dg Wrist Complete Right  Result Date: 07/22/2018 CLINICAL DATA:  48 year old male with flu like symptoms. Body aches. Pain. Fall several days ago with right upper extremity pain and swelling. EXAM: RIGHT WRIST - COMPLETE 3+ VIEW COMPARISON:  None. FINDINGS: Bone mineralization is within normal limits. Generalized soft tissue swelling. Distal radius and ulna are intact. Carpal bones appear intact and normally aligned. Chronic appearing degenerative spurring and mild fragmentation at the base of the 5th metacarpal. No acute fracture identified. IMPRESSION: Chronic appearing changes at the base of the 5th metacarpal. No acute fracture or dislocation identified. Electronically Signed   By: Odessa Fleming M.D.   On: 07/22/2018  16:48   Ct Head Wo Contrast  Result Date: 07/22/2018 CLINICAL DATA:  Pain across the neck and shoulders bilaterally. Hot and cold chills. EXAM: CT HEAD WITHOUT CONTRAST CT CERVICAL SPINE WITHOUT CONTRAST TECHNIQUE: Multidetector CT imaging of the head and cervical spine was performed following the standard protocol without intravenous contrast. Multiplanar CT image reconstructions of the cervical spine were also generated. COMPARISON:  None. FINDINGS: CT HEAD FINDINGS Brain: No evidence of acute infarction, hemorrhage, hydrocephalus, extra-axial collection or mass lesion/mass effect. Vascular: No hyperdense vessel or unexpected calcification. Skull: Normal. Negative for fracture or focal lesion. Sinuses/Orbits: No acute finding. Other: None. CT CERVICAL SPINE FINDINGS Alignment: Slight reversal cervical lordosis at C5-6 attributable to degenerative disc disease. Skull base and vertebrae: Intact skull base. Bone island noted of the C4 vertebral body on the left. No acute cervical spine fracture. Uncovertebral joint erosive osteoarthritis with subchondral erosive changes noted at C5-6 on the right. Jumped or perched facets. Soft tissues and spinal canal: No prevertebral fluid or swelling. No visible canal hematoma. Disc levels: Moderate disc flattening C5-6 with small posterior marginal osteophytes. No significant foraminal encroachment. Upper chest: Negative. Other: None IMPRESSION: 1. No acute intracranial abnormality. 2. 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 No acute cervical spine fracture. Electronically Signed   By: Tollie Eth M.D.   On: 07/22/2018 20:14   Ct Chest Wo Contrast  Result Date: 07/23/2018 CLINICAL DATA:  General body aches, pain across the shoulders for 5 days. Larey Seat several days ago. EXAM: CT CHEST WITHOUT CONTRAST TECHNIQUE: Multidetector CT imaging of the chest  was performed following the standard protocol without IV contrast. COMPARISON:  None. FINDINGS: Cardiovascular: Evaluation of vascular structures is limited without IV contrast material. Normal heart size. No pericardial effusion. Scattered coronary artery calcifications. Normal caliber thoracic aorta. Scattered aortic calcification. Mediastinum/Nodes: Esophagus is decompressed. No significant mediastinal lymphadenopathy. Moderately prominent axillary lymph nodes without pathologic enlargement, likely reactive. Lungs/Pleura: Emphysematous changes in the lungs. Mild dependent atelectasis. No airspace disease or consolidation is suggested. No pleural effusions. No  pneumothorax. Airways are patent. Upper Abdomen: No acute abnormalities. Musculoskeletal: Normal alignment of the thoracic spine. No destructive bone lesions. IMPRESSION: No evidence of active pulmonary disease. Emphysematous changes in the lungs. Aortic Atherosclerosis (ICD10-I70.0) and Emphysema (ICD10-J43.9). Electronically Signed   By: Burman Nieves M.D.   On: 07/23/2018 01:01   Ct Cervical Spine Wo Contrast  Result Date: 07/22/2018 CLINICAL DATA:  Pain across the neck and shoulders bilaterally. Hot and cold chills. EXAM: CT HEAD WITHOUT CONTRAST CT CERVICAL SPINE WITHOUT CONTRAST TECHNIQUE: Multidetector CT imaging of the head and cervical spine was performed following the standard protocol without intravenous contrast. Multiplanar CT image reconstructions of the cervical spine were also generated. COMPARISON:  None. FINDINGS: CT HEAD FINDINGS Brain: No evidence of acute infarction, hemorrhage, hydrocephalus, extra-axial collection or mass lesion/mass effect. Vascular: No hyperdense vessel or unexpected calcification. Skull: Normal. Negative for fracture or focal lesion. Sinuses/Orbits: No acute finding. Other: None. CT CERVICAL SPINE FINDINGS Alignment: Slight reversal cervical lordosis at C5-6 attributable to degenerative disc disease. Skull  base and vertebrae: Intact skull base. Bone island noted of the C4 vertebral body on the left. No acute cervical spine fracture. Uncovertebral joint erosive osteoarthritis with subchondral erosive changes noted at C5-6 on the right. Jumped or perched facets. Soft tissues and spinal canal: No prevertebral fluid or swelling. No visible canal hematoma. Disc levels: Moderate disc flattening C5-6 with small posterior marginal osteophytes. No significant foraminal encroachment. Upper chest: Negative. Other: None IMPRESSION: 1. No acute intracranial abnormality. 2. 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 No acute cervical spine fracture. Electronically Signed   By: Tollie Eth M.D.   On: 07/22/2018 20:14   Dg Hand Complete Right  Result Date: 07/22/2018 CLINICAL DATA:  Initial evaluation for acute general right hand pain with swelling. EXAM: RIGHT HAND - COMPLETE 3+ VIEW COMPARISON:  None. FINDINGS: No acute fracture or dislocation. Chronic changes at the base of the right fifth metacarpal. Joint spaces otherwise maintained without evidence for significant degenerative or erosive arthropathy. No discrete osseous lesions. No soft tissue abnormality. IMPRESSION: 1. No acute osseous abnormality. 2. Chronic changes at the base of the right fifth metacarpal. Electronically Signed   By: Rise Mu M.D.   On: 07/22/2018 20:04   Dg Foot Complete Left  Result Date: 07/22/2018 CLINICAL DATA:  Initial evaluation for acute generalized left foot pain with swelling. EXAM: LEFT FOOT - COMPLETE 3+ VIEW COMPARISON:  None. FINDINGS: Chronic changes at the medial tarsometatarsal articulations. Joint spaces otherwise maintained without evidence for significant degenerative or erosive arthropathy. No acute fracture dislocation. No discrete osseous lesions. No soft tissue abnormality. IMPRESSION: 1.  No acute osseous abnormality. 2. Chronic changes at the medial tarsometatarsal articulations. Electronically Signed   By: Rise Mu M.D.   On: 07/22/2018 20:07    Review of Systems  Constitutional: Positive for chills and fever.  Musculoskeletal: Positive for falls, joint pain, myalgias and neck pain.  All other systems reviewed and are negative.  Blood pressure 135/83, pulse 92, temperature (!) 100.6 F (38.1 C), temperature source Oral, resp. rate 20, height  (1.854 m), weight 71.2 kg, SpO2 99 %. Physical Exam  Constitutional: He is oriented to person, place, and time. He appears well-developed.  HENT:  Head: Atraumatic.  Eyes: EOM are normal.  Cardiovascular: Intact distal pulses.  Respiratory: Effort normal.  Musculoskeletal:     Comments: Examination of the right hand  shows some diffuse soft tissue swelling over the dorsum distal to the wrist.  Does not extend into the fingers.  He is able to move his fingers and wrist with some discomfort on the dorsum of the hand.  He has diffuse tenderness over this region.  It is warm and slightly erythematous but there is no palpable fluid collection or abscess, but rather just diffuse soft tissue swelling. Examination of the left ankle shows mild warmth with tenderness diffusely around the ankle.  No palpable effusion.  He does have pain with range of motion of the ankle and foot.  Neurological: He is alert and oriented to person, place, and time.  Skin: Skin is warm and dry.  Psychiatric: He has a normal mood and affect.    Assessment/Plan: Multiple joint pain with systemic inflammatory response I do not feel there is anything to aspirate in the hand.  The joints do not appear involved and it seems to be superficial soft tissue involvement, consistent with cellulitis.  I recommend empiric therapy for this. Regarding his left ankle pain, I did go ahead and aspirate this to try and assist with diagnosis, although was unable to  get more than a couple drops of benign-appearing joint fluid.  I do not feel that the joint is infected and likely again more inflammatory driven.  Would recommend icing and anti-inflammatories if possible.  Procedure: The left ankle was sterilely prepped with Betadine solution and the anteromedial portal position was infiltrated with 3 cc 1% lidocaine without epinephrine.  An 18-gauge needle was then used to enter the joint.  I was only able to aspirate a couple drops of benign-appearing joint fluid which was not enough to send for evaluation.  Several attempts were made to remove the needle but no fluid was attainable.  The needle was withdrawn.  The patient tolerated the procedure well.  I will follow along peripherally.  Please call with questions.  Berline Lopes 07/23/2018, 7:42 AM

## 2018-07-23 NOTE — Consult Note (Signed)
Referring Provider: No ref. provider found Primary Care Physician:  Patient, No Pcp Per Primary Gastroenterologist: unassigned   Reason for Consultation: Upper GI Bleed   HPI: Dillon Schaefer is a 48 y.o. male with a history of EtOH abuse, iron deficiency anemia, soap pica, duodenitis, UGI Bleed 11/2014 who presented to the ED 07/22/2018 with complaints of weakness, bilateral leg pain and intermittent black stool.  He reports drinking 1 pint of liquor daily, sometimes drinks almost 2 pints. He notices black stools when he drinks alcohol. He developed generalized weakness af few days ago. He felt dizzy and reported falling at home on 3 nights ago while in the bathroom. He landed and injured his right hand which is swollen. He got up and ran to his bed room, fell again, passed out briefly. He developed neck and left leg pain. NSAID use is unclear. Dizziness and weakness persisted so he presented to the ED for further evaluation. [facility] in a homeless [facility].  ED Course: Sodium 131.  Potassium 4.0.  Glucose 116.  BUN 18.  Creatinine 0.73.  Alk phos 63.  Albumin 2.9.  AST 17.  ALT 16.  Total bilirubin 0.5.  WBC 28.7.  Hemoglobin 7.3.  Hematocrit 26.3.  MCV 68.1 platelets 259.  He was transfused with 1 unit of packed red blood cells.  Repeat hemoglobin 6.8.  Hematocrit 24.7. Head CT: negative  GI History:  12/06/2014 Admitted to [facility] with UGI bleed, epigastric pain, N/V. Hg 12.8. HCT38.5. 12/06/2014 Abdominal/Pelvic CT: Enlarged stomach with marked gastric fold thickening. Thickening of duodenal folds. This may indicate result of gastritis/duodenitis given with patient's history. Surrounding prominent gastric vessels. The portal vein does not appear to be occluded. No lower esophageal varices. No extra luminal bowel inflammatory process, free fluid or free air. Fatty infiltration of the liver. Age advanced atherosclerotic type changes as detailed above.   EGD 12/07/2014 identified Esophagus: LA Grade A  esophagitis. The GE junction and Z-line were at 40 cm. Stomach: Normal appearing mucosa. No hiatal hernia seen on retroflexion. Duodenum: The pylorus was patent. The first and second part of the duodenum were examined and appeared normal  05/2014 EGD/colonoscopy [facility]: no source of bleeding identified, no abnormal pathology   Prior to Admission medications   Medication Sig Start Date End Date Taking? Authorizing Provider  acetaminophen (TYLENOL) 325 MG tablet Take 650 mg by mouth every 4 (four) hours as needed for moderate pain or headache.   Yes [provider]  ibuprofen (ADVIL,MOTRIN) 200 MG tablet Take 400 mg by mouth every 6 (six) hours as needed for headache or moderate pain.   Yes [provider]  amoxicillin (AMOXIL) 500 MG capsule Take 1 capsule (500 mg total) by mouth 3 (three) times daily. Patient not taking: Reported on 07/22/2018 04/09/15   Ashley Murrain, NP  naproxen (NAPROSYN) 500 MG tablet Take 1 tablet (500 mg total) by mouth 2 (two) times daily. Patient not taking: Reported on 07/22/2018 04/09/15   Ashley Murrain, NP    Current Facility-Administered Medications  Medication Dose Route Frequency Provider Last Rate Last Dose   0.9 %  sodium chloride infusion (Manually program via Guardrails IV Fluids)   Intravenous Once Toy Baker, MD       [START ON 07/24/2018] Influenza vac split quadrivalent PF (FLUARIX) injection 0.5 mL  0.5 mL Intramuscular Tomorrow-1000 Hosie Poisson, MD       lidocaine (PF) (XYLOCAINE) 1 % injection  pantoprazole (PROTONIX) injection 40 mg  40 mg Intravenous Q12H Doutova, Anastassia, MD   40 mg at 07/23/18 1108   sodium chloride flush (NS) 0.9 % injection 3 mL  3 mL Intravenous Once Valarie Merino, MD       vancomycin (VANCOCIN) 1,500 mg in sodium chloride 0.9 % 500 mL IVPB  1,500 mg Intravenous Q12H Toy Baker, MD   Stopped at 07/23/18 1158   Current Outpatient Medications  Medication Sig Dispense Refill     acetaminophen (TYLENOL) 325 MG tablet Take 650 mg by mouth every 4 (four) hours as needed for moderate pain or headache.     ibuprofen (ADVIL,MOTRIN) 200 MG tablet Take 400 mg by mouth every 6 (six) hours as needed for headache or moderate pain.     amoxicillin (AMOXIL) 500 MG capsule Take 1 capsule (500 mg total) by mouth 3 (three) times daily. (Patient not taking: Reported on 07/22/2018) 21 capsule 0   naproxen (NAPROSYN) 500 MG tablet Take 1 tablet (500 mg total) by mouth 2 (two) times daily. (Patient not taking: Reported on 07/22/2018) 30 tablet 0    Allergies as of 07/22/2018   (No Known Allergies)    Family History  Problem Relation Age of Onset   Diabetes Other     Social History   Socioeconomic History   Marital status: Single    Spouse name: Not on file   Number of children: Not on file   Years of education: Not on file   Highest education level: Not on file  Occupational History   Not on file  Social Needs   Financial resource strain: Not on file   Food insecurity:    Worry: Not on file    Inability: Not on file   Transportation needs:    Medical: Not on file    Non-medical: Not on file  Tobacco Use   Smoking status: Current Every Day Smoker    Packs/day: 0.50   Smokeless tobacco: Never Used  Substance and Sexual Activity   Alcohol use: Yes   Drug use: Yes    Frequency: 7.0 times per week    Types: Marijuana   Sexual activity: Not on file  Lifestyle   Physical activity:    Days per week: Not on file    Minutes per session: Not on file   Stress: Not on file  Relationships   Social connections:    Talks on phone: Not on file    Gets together: Not on file    Attends religious service: Not on file    Active member of club or organization: Not on file    Attends meetings of clubs or organizations: Not on file    Relationship status: Not on file   Intimate partner violence:    Fear of current or ex partner: Not on file     Emotionally abused: Not on file    Physically abused: Not on file    Forced sexual activity: Not on file  Other Topics Concern   Not on file  Social History Narrative   Not on file    Review of Systems: Gen: + weight loss, no fever, sweats or chills  CV: no chest pain, + syncope see HPI Resp: No cough, SOB or hemoptysis GI: see HPI GU : No urinary complaints  MS: neck, right hand and bilateral leg pain Derm: no lesions or rash Heme: Denies bruising, bleeding, and enlarged lymph nodes. Neuro:  + headaches  Endo:  Denies any  problems with DM, thyroid, adrenal function.  Physical Exam: Vital signs in last 24 hours: Temp:  [99.2 F (37.3 C)-100.6 F (38.1 C)] 99.7 F (37.6 C) (03/12 1206) Pulse Rate:  [76-110] 78 (03/12 1330) Resp:  [13-24] 17 (03/12 1330) BP: (114-146)/(76-95) 120/83 (03/12 1330) SpO2:  [93 %-100 %] 99 % (03/12 1330) Weight:  [71.2 kg] 71.2 kg (03/11 1524)   General:   Alert, thin male in NAD Head:  Normocephalic and atraumatic. Eyes:  Sclera clear, no icterus. Conjunctiva pink. Ears:  Normal auditory acuity. Neck:  Supple Lungs:  Clear throughout to auscultation.   No wheezes, crackles, or rhonchi.  Heart:  Regular rate and rhythm; no murmurs, clicks, rubs,  or gallops. Abdomen:  Soft,nontender, BS active,nonpalp mass or hsm.   Rectal:  Deferred  Extremities: right hand edema, LLE/ankle with edema, erythema c/w cellulitis  Neurologic:  Alert and  oriented x4;  grossly normal neurologically. Skin:  Intact without significant lesions or rashes.. Psych:  Alert and cooperative. Normal mood and affect.  Intake/Output from previous day: No intake/output data recorded. Intake/Output this shift: Total I/O In: 638 [Blood:638] Out: -   Lab Results: Recent Labs    07/22/18 1526 07/23/18 0150  WBC 28.7* 27.5*  HGB 7.3* 6.8*  HCT 26.3* 24.7*  PLT 259 253   BMET Recent Labs    07/22/18 1526  NA 131*  K 4.0  CL 96*  CO2 24  GLUCOSE 116*  BUN  18  CREATININE 0.73  CALCIUM 8.7*   LFT Recent Labs    07/22/18 1526  PROT 6.9  ALBUMIN 2.9*  AST 17  ALT 16  ALKPHOS 63  BILITOT 0.5   PT/INR Recent Labs    07/23/18 0150  LABPROT 13.4  INR 1.0   Hepatitis Panel No results for input(s): HEPBSAG, HCVAB, HEPAIGM, HEPBIGM in the last 72 hours.    Studies/Results: Dg Chest 2 View  Result Date: 07/22/2018 CLINICAL DATA:  48 year old male with flu like symptoms. Body aches and pain. Smoker. EXAM: CHEST - 2 VIEW COMPARISON:  None. FINDINGS: Lung volumes at the upper limits of normal. Mediastinal contours are within normal limits. Visualized tracheal air column is within normal limits. No pneumothorax, pleural effusion or confluent pulmonary opacity. Mild diffuse increased pulmonary interstitial markings. No acute osseous abnormality identified. Negative visible bowel gas pattern. IMPRESSION: Mild diffuse increased pulmonary interstitial markings which could be smoking related, but consider viral/atypical respiratory infection in this clinical setting. No pleural effusion. Electronically Signed   By: Genevie Ann M.D.   On: 07/22/2018 16:46   Dg Wrist Complete Right  Result Date: 07/22/2018 CLINICAL DATA:  48 year old male with flu like symptoms. Body aches. Pain. Fall several days ago with right upper extremity pain and swelling. EXAM: RIGHT WRIST - COMPLETE 3+ VIEW COMPARISON:  None. FINDINGS: Bone mineralization is within normal limits. Generalized soft tissue swelling. Distal radius and ulna are intact. Carpal bones appear intact and normally aligned. Chronic appearing degenerative spurring and mild fragmentation at the base of the 5th metacarpal. No acute fracture identified. IMPRESSION: Chronic appearing changes at the base of the 5th metacarpal. No acute fracture or dislocation identified. Electronically Signed   By: Genevie Ann M.D.   On: 07/22/2018 16:48   Ct Head Wo Contrast  Result Date: 07/22/2018 CLINICAL DATA:  Pain across the  neck and shoulders bilaterally. Hot and cold chills. EXAM: CT HEAD WITHOUT CONTRAST CT CERVICAL SPINE WITHOUT CONTRAST TECHNIQUE: Multidetector CT imaging of the head and cervical spine  was performed following the standard protocol without intravenous contrast. Multiplanar CT image reconstructions of the cervical spine were also generated. COMPARISON:  None. FINDINGS: CT HEAD FINDINGS Brain: No evidence of acute infarction, hemorrhage, hydrocephalus, extra-axial collection or mass lesion/mass effect. Vascular: No hyperdense vessel or unexpected calcification. Skull: Normal. Negative for fracture or focal lesion. Sinuses/Orbits: No acute finding. Other: None. CT CERVICAL SPINE FINDINGS Alignment: Slight reversal cervical lordosis at C5-6 attributable to degenerative disc disease. Skull base and vertebrae: Intact skull base. Bone island noted of the C4 vertebral body on the left. No acute cervical spine fracture. Uncovertebral joint erosive osteoarthritis with subchondral erosive changes noted at C5-6 on the right. Jumped or perched facets. Soft tissues and spinal canal: No prevertebral fluid or swelling. No visible canal hematoma. Disc levels: Moderate disc flattening C5-6 with small posterior marginal osteophytes. No significant foraminal encroachment. Upper chest: Negative. Other: None IMPRESSION: 1. No acute intracranial abnormality. 2. 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 No acute cervical spine fracture. Electronically Signed   By: Ashley Royalty M.D.   On: 07/22/2018 20:14   Ct Chest Wo Contrast  Result Date: 07/23/2018 CLINICAL DATA:  General body aches, pain across the shoulders for 5 days. [place] several days ago. EXAM: CT CHEST WITHOUT CONTRAST TECHNIQUE: Multidetector CT imaging of the chest was performed following the standard protocol without IV contrast. COMPARISON:  None.  FINDINGS: Cardiovascular: Evaluation of vascular structures is limited without IV contrast material. Normal heart size. No pericardial effusion. Scattered coronary artery calcifications. Normal caliber thoracic aorta. Scattered aortic calcification. Mediastinum/Nodes: Esophagus is decompressed. No significant mediastinal lymphadenopathy. Moderately prominent axillary lymph nodes without pathologic enlargement, likely reactive. Lungs/Pleura: Emphysematous changes in the lungs. Mild dependent atelectasis. No airspace disease or consolidation is suggested. No pleural effusions. No pneumothorax. Airways are patent. Upper Abdomen: No acute abnormalities. Musculoskeletal: Normal alignment of the thoracic spine. No destructive bone lesions. IMPRESSION: No evidence of active pulmonary disease. Emphysematous changes in the lungs. Aortic Atherosclerosis (ICD10-I70.0) and Emphysema (ICD10-J43.9). Electronically Signed   By: Lucienne Capers M.D.   On: 07/23/2018 01:01   Ct Cervical Spine Wo Contrast  Result Date: 07/22/2018 CLINICAL DATA:  Pain across the neck and shoulders bilaterally. Hot and cold chills. EXAM: CT HEAD WITHOUT CONTRAST CT CERVICAL SPINE WITHOUT CONTRAST TECHNIQUE: Multidetector CT imaging of the head and cervical spine was performed following the standard protocol without intravenous contrast. Multiplanar CT image reconstructions of the cervical spine were also generated. COMPARISON:  None. FINDINGS: CT HEAD FINDINGS Brain: No evidence of acute infarction, hemorrhage, hydrocephalus, extra-axial collection or mass lesion/mass effect. Vascular: No hyperdense vessel or unexpected calcification. Skull: Normal. Negative for fracture or focal lesion. Sinuses/Orbits: No acute finding. Other: None. CT CERVICAL SPINE FINDINGS Alignment: Slight reversal cervical lordosis at C5-6 attributable to degenerative disc disease. Skull base and vertebrae: Intact skull base. Bone island noted of the C4 vertebral body on the  left. No acute cervical spine fracture. Uncovertebral joint erosive osteoarthritis with subchondral erosive changes noted at C5-6 on the right. Jumped or perched facets. Soft tissues and spinal canal: No prevertebral fluid or swelling. No visible canal hematoma. Disc levels: Moderate disc flattening C5-6 with small posterior marginal osteophytes. No significant foraminal encroachment. Upper chest: Negative. Other: None IMPRESSION: 1. No acute intracranial abnormality. 2. 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  No acute cervical spine fracture. Electronically Signed   By: Ashley Royalty M.D.   On: 07/22/2018 20:14   Dg Hand Complete Right  Result Date: 07/22/2018 CLINICAL DATA:  Initial evaluation for acute general right hand pain with swelling. EXAM: RIGHT HAND - COMPLETE 3+ VIEW COMPARISON:  None. FINDINGS: No acute fracture or dislocation. Chronic changes at the base of the right fifth metacarpal. Joint spaces otherwise maintained without evidence for significant degenerative or erosive arthropathy. No discrete osseous lesions. No soft tissue abnormality. IMPRESSION: 1. No acute osseous abnormality. 2. Chronic changes at the base of the right fifth metacarpal. Electronically Signed   By: Jeannine Boga M.D.   On: 07/22/2018 20:04   Dg Foot Complete Left  Result Date: 07/22/2018 CLINICAL DATA:  Initial evaluation for acute generalized left foot pain with swelling. EXAM: LEFT FOOT - COMPLETE 3+ VIEW COMPARISON:  None. FINDINGS: Chronic changes at the medial tarsometatarsal articulations. Joint spaces otherwise maintained without evidence for significant degenerative or erosive arthropathy. No acute fracture dislocation. No discrete osseous lesions. No soft tissue abnormality. IMPRESSION: 1. No acute osseous abnormality. 2. Chronic changes at the medial tarsometatarsal  articulations. Electronically Signed   By: Jeannine Boga M.D.   On: 07/22/2018 20:07    IMPRESSION/PLAN  1. 48 y.o. male with a history of alcohol abuse, prior hx of iron deficiency anemia, duodenitis, EGD/colonoscopy in 2016  did not explain his anemia who presents to the ED with dizziness, weakness, N/V and melena. Hg 7.3. - 6.8. Transfused with 1 unit PRBCs. Post transfusion H/H pending.  -EGD and colonoscopy 07/24/2018 -Pantoprazole '40mg'$   IV BID -Clear liquid diet, Clear Ensure -NPO after midnight -Monitor H/H closely  -iron/TIBC/Ferritin levels ordered   2. Anemia secondary to UGI or lower GI bleeding -EGD/colonoscopy to rule out GI bleeding vs malignancy -transfuse for Hg < 8  3. Leukocytosis: WBC 27-28 ? Left lower leg/ankle cellulitis -monitor temp, WBC -further management per hospitalist  4. Etoh Abuse -no alcohol -monitor patient for withdraw sx        Noralyn Pick  07/23/2018, 2:02 PM

## 2018-07-23 NOTE — Progress Notes (Signed)
Patient ID: Dillon Schaefer, male   DOB: 02/01/1971, 47 y.o.   MRN: [id number]  BRIEF GI UPDATE NOTE: Post transfusion Hg 8.3. HCT 29.5. Unable to secure time slot for EGD/colonoscopy 07/24/2018. No bowel prep tonight. Pt to remain NPO after midnight for possible EGD 07/24/2018, to further clarify in am.   

## 2018-07-23 NOTE — H&P (View-Only) (Signed)
Patient ID: Dillon Schaefer, male   DOB: 23-Apr-1971, 48 y.o.   MRN: [id number]  BRIEF GI UPDATE NOTE: Post transfusion Hg 8.3. HCT 29.5. Unable to secure time slot for EGD/colonoscopy 07/24/2018. No bowel prep tonight. Pt to remain NPO after midnight for possible EGD 07/24/2018, to further clarify in am.

## 2018-07-24 ENCOUNTER — Inpatient Hospital Stay (HOSPITAL_COMMUNITY): Payer: Self-pay

## 2018-07-24 ENCOUNTER — Encounter (HOSPITAL_COMMUNITY)
Admission: EM | Disposition: A | Discharge status: Home / Self Care | Payer: Self-pay | Source: Home / Self Care | Attending: Internal Medicine

## 2018-07-24 ENCOUNTER — Inpatient Hospital Stay (HOSPITAL_COMMUNITY): Payer: Self-pay | Admitting: Anesthesiology

## 2018-07-24 ENCOUNTER — Encounter (HOSPITAL_COMMUNITY): Payer: Self-pay | Admitting: *Deleted

## 2018-07-24 DIAGNOSIS — E440 Moderate protein-calorie malnutrition: Secondary | ICD-10-CM

## 2018-07-24 DIAGNOSIS — K259 Gastric ulcer, unspecified as acute or chronic, without hemorrhage or perforation: Secondary | ICD-10-CM

## 2018-07-24 DIAGNOSIS — K2970 Gastritis, unspecified, without bleeding: Secondary | ICD-10-CM

## 2018-07-24 DIAGNOSIS — K2990 Gastroduodenitis, unspecified, without bleeding: Secondary | ICD-10-CM

## 2018-07-24 DIAGNOSIS — K2100 Gastro-esophageal reflux disease with esophagitis, without bleeding: Secondary | ICD-10-CM

## 2018-07-24 HISTORY — PX: BIOPSY: SHX5522

## 2018-07-24 HISTORY — PX: ESOPHAGOGASTRODUODENOSCOPY (EGD) WITH PROPOFOL: SHX5813

## 2018-07-24 LAB — TYPE AND SCREEN
ABO/RH(D): A POS
Antibody Screen: NEGATIVE
UNIT DIVISION: 0

## 2018-07-24 LAB — CBC
HCT: 29 % — ABNORMAL LOW (ref 39.0–52.0)
Hemoglobin: 8 g/dL — ABNORMAL LOW (ref 13.0–17.0)
MCH: 20.2 pg — ABNORMAL LOW (ref 26.0–34.0)
MCHC: 27.6 g/dL — AB (ref 30.0–36.0)
MCV: 73.2 fL — ABNORMAL LOW (ref 80.0–100.0)
Platelets: 330 10*3/uL (ref 150–400)
RBC: 3.96 MIL/uL — ABNORMAL LOW (ref 4.22–5.81)
RDW: 23.1 % — ABNORMAL HIGH (ref 11.5–15.5)
WBC: 21 10*3/uL — AB (ref 4.0–10.5)
nRBC: 0 % (ref 0.0–0.2)

## 2018-07-24 LAB — BASIC METABOLIC PANEL
Anion gap: 11 (ref 5–15)
BUN: 11 mg/dL (ref 6–20)
CO2: 21 mmol/L — ABNORMAL LOW (ref 22–32)
Calcium: 8.5 mg/dL — ABNORMAL LOW (ref 8.9–10.3)
Chloride: 103 mmol/L (ref 98–111)
Creatinine, Ser: 0.66 mg/dL (ref 0.61–1.24)
GFR calc Af Amer: >60 mL/min (ref >60)
GFR calc non Af Amer: >60 mL/min (ref >60)
Glucose, Bld: 98 mg/dL (ref 70–99)
Potassium: 3.9 mmol/L (ref 3.5–5.1)
Sodium: 135 mmol/L (ref 135–145)

## 2018-07-24 LAB — BPAM RBC
BLOOD PRODUCT EXPIRATION DATE: 202004062359
ISSUE DATE / TIME: 202003120703
Unit Type and Rh: 6200

## 2018-07-24 LAB — LACTIC ACID, PLASMA: Lactic Acid, Venous: 0.6 mmol/L (ref 0.5–1.9)

## 2018-07-24 LAB — HIV ANTIBODY (ROUTINE TESTING W REFLEX): HIV Screen 4th Generation wRfx: NONREACTIVE

## 2018-07-24 SURGERY — ESOPHAGOGASTRODUODENOSCOPY (EGD) WITH PROPOFOL
Anesthesia: Monitor Anesthesia Care

## 2018-07-24 MED ORDER — ORAL CARE MOUTH RINSE
15.0000 mL | Freq: Two times a day (BID) | OROMUCOSAL | Status: DC
  Administered 2018-07-24 – 2018-07-26 (×4): 15 mL via OROMUCOSAL

## 2018-07-24 MED ORDER — SODIUM CHLORIDE 0.9 % IV SOLN
25.0000 mg | Freq: Once | INTRAVENOUS | Status: AC
  Administered 2018-07-24: 25 mg via INTRAVENOUS
  Filled 2018-07-24: qty 0.5

## 2018-07-24 MED ORDER — KETAMINE HCL 10 MG/ML IJ SOLN
INTRAMUSCULAR | Status: AC
  Filled 2018-07-24: qty 1

## 2018-07-24 MED ORDER — HYDROMORPHONE HCL 1 MG/ML IJ SOLN
0.5000 mg | INTRAMUSCULAR | Status: DC | PRN
  Administered 2018-07-24 – 2018-07-26 (×3): 0.5 mg via INTRAVENOUS
  Filled 2018-07-24 (×4): qty 0.5

## 2018-07-24 MED ORDER — OXYCODONE-ACETAMINOPHEN 5-325 MG PO TABS
1.0000 | ORAL_TABLET | ORAL | Status: DC | PRN
  Administered 2018-07-24 – 2018-07-25 (×3): 2 via ORAL
  Filled 2018-07-24 (×3): qty 2

## 2018-07-24 MED ORDER — PROPOFOL 500 MG/50ML IV EMUL
INTRAVENOUS | Status: DC | PRN
  Administered 2018-07-24: 40 mg via INTRAVENOUS
  Administered 2018-07-24 (×3): 20 mg via INTRAVENOUS

## 2018-07-24 MED ORDER — LACTATED RINGERS IV SOLN
INTRAVENOUS | Status: DC
  Administered 2018-07-24: 14:00:00 via INTRAVENOUS

## 2018-07-24 MED ORDER — PROPOFOL 500 MG/50ML IV EMUL
INTRAVENOUS | Status: DC | PRN
  Administered 2018-07-24: 150 ug/kg/min via INTRAVENOUS

## 2018-07-24 MED ORDER — TRAMADOL HCL 50 MG PO TABS
100.0000 mg | ORAL_TABLET | Freq: Four times a day (QID) | ORAL | Status: DC | PRN
  Administered 2018-07-24: 100 mg via ORAL
  Filled 2018-07-24: qty 2

## 2018-07-24 MED ORDER — CHLORHEXIDINE GLUCONATE CLOTH 2 % EX PADS
6.0000 | MEDICATED_PAD | Freq: Every day | CUTANEOUS | Status: DC
  Administered 2018-07-26: 6 via TOPICAL

## 2018-07-24 MED ORDER — PROPOFOL 10 MG/ML IV BOLUS
INTRAVENOUS | Status: AC
  Filled 2018-07-24: qty 20

## 2018-07-24 MED ORDER — PROPOFOL 10 MG/ML IV BOLUS
INTRAVENOUS | Status: AC
  Filled 2018-07-24: qty 40

## 2018-07-24 MED ORDER — SODIUM CHLORIDE 0.9 % IV SOLN
500.0000 mg | Freq: Once | INTRAVENOUS | Status: AC
  Administered 2018-07-24: 500 mg via INTRAVENOUS
  Filled 2018-07-24: qty 10

## 2018-07-24 MED ORDER — ENSURE ENLIVE PO LIQD
237.0000 mL | Freq: Two times a day (BID) | ORAL | Status: DC
  Administered 2018-07-24 – 2018-07-28 (×9): 237 mL via ORAL

## 2018-07-24 MED ORDER — KETAMINE HCL 10 MG/ML IJ SOLN
INTRAMUSCULAR | Status: DC | PRN
  Administered 2018-07-24: 20 mg via INTRAVENOUS

## 2018-07-24 MED ORDER — SODIUM CHLORIDE 0.9 % IV SOLN: INTRAVENOUS | Status: DC

## 2018-07-24 MED ORDER — ADULT MULTIVITAMIN W/MINERALS CH
1.0000 | ORAL_TABLET | Freq: Every day | ORAL | Status: DC
  Administered 2018-07-25 – 2018-07-27 (×4): 1 via ORAL
  Filled 2018-07-24 (×4): qty 1

## 2018-07-24 SURGICAL SUPPLY — 15 items

## 2018-07-24 NOTE — Progress Notes (Signed)
Initial Nutrition Assessment  DOCUMENTATION CODES:   Non-severe (moderate) malnutrition in context of chronic illness  INTERVENTION:  - Will order Ensure Enlive BID for once diet advanced to at least FLD, each supplement provides 350 kcal and 20 grams of protein. - Will order daily multivitamin with minerals. - Diet advancement as medically feasible.    NUTRITION DIAGNOSIS:   Moderate Malnutrition related to social / environmental circumstances(alcohol abuse) as evidenced by mild fat depletion, mild muscle depletion.  GOAL:   Patient will meet greater than or equal to 90% of their needs  MONITOR:   Diet advancement, PO intake, Supplement acceptance, Weight trends, Labs  REASON FOR ASSESSMENT:   Consult Assessment of nutrition requirement/status  ASSESSMENT:   48 y.o. male with medical history significant of duodenitis, alcohol abuse, and pica. Patient admitted with complaints of night sweats, BLE swelling, and 4-5 day hx of neck and shoulder pain which progressed to all over, generalized weakness and pain. Patient continues to drink alcohol and sometimes experiences tremors when he stops drinking.   BMI indicates normal weight. No intakes since admission although patient reports having a few sips of chicken broth last night (while on FLD). He states last meal was 5-7 days ago and was a sardine sandwich and a bottle of Ensure. He reports that it is not unusual for him to go 3-4 days without eating anything and states that this is d/t no time to eat at work and because he does not cook. He drinks Ensure daily and will often drink 4-6 bottles/day, per his report.   He states that he continues to drink daily and drinks 1/2 gallon of vodka/day. He also reports pica which has been ongoing since he was 48 years old. He eats bar soap and typically eats 1 bar/day. He notices that eating soap leads to SOB, abdominal discomfort, weakness, and diarrhea. For the 2 days PTA he was staying with a  neighbor and did not eat soap those days and noticed that he was not experiencing any of these symptoms and that he was able to take a deep breath without issue, something he has not been able to do in years.   Talked with patient about pica and about how it indicates a vitamin or mineral deficiency in the body. Patient did not seem to be in a place for learning or making long-term changes at this time although he does state that he is switching to body wash rather than using bar soap for bathing so that he no longer has bar soap in his house. He states he will not consume body wash.   Per chart review, current weight is 157 lb. Per review of Care Everywhere, he weighed 163 lb at [facility] in November 2016. No other weight hx available in the chart. Unable to ask patient about weight during this visit.   Possible EGD scheduled for today. Patient has been NPO since midnight.    Medications reviewed; 40 mg IV protonix BID.  Labs reviewed; Ca: 8.5 mg/dl.      NUTRITION - FOCUSED PHYSICAL EXAM:    Most Recent Value  Orbital Region  Mild depletion  Upper Arm Region  Mild depletion  Thoracic and Lumbar Region  Unable to assess  Buccal Region  Mild depletion  Temple Region  No depletion  Clavicle Bone Region  Mild depletion  Clavicle and Acromion Bone Region  Mild depletion  Scapular Bone Region  No depletion  Dorsal Hand  Mild depletion  Patellar  Region  Unable to assess  Anterior Thigh Region  Unable to assess  Posterior Calf Region  Unable to assess  Edema (RD Assessment)  Unable to assess  Hair  Reviewed  Eyes  Reviewed  Mouth  Reviewed  Skin  Reviewed  Nails  Reviewed       Diet Order:   Diet Order            Diet NPO time specified  Diet effective midnight              EDUCATION NEEDS:   Education needs have been addressed  Skin:  Skin Assessment: Reviewed RN Assessment  Last BM:  3/9  Height:   Ht Readings from Last 1 Encounters:  07/22/18 6\' 1"   (1.854 m)    Weight:   Wt Readings from Last 1 Encounters:  07/22/18 71.2 kg    Ideal Body Weight:  83.64 kg  BMI:  Body mass index is 20.71 kg/m.  Estimated Nutritional Needs:   Kcal:  1900-2100  Protein:  90-100 grams  Fluid:  >/= 2 L/day     Trenton Gammon, MS, RD, LDN, [facility] Inpatient Clinical Dietitian Pager # [phone] After hours/weekend pager # [phone]

## 2018-07-24 NOTE — Op Note (Signed)
[facility] Patient Name: Dillon Schaefer Procedure Date: 07/24/2018 MRN: [id number] Attending MD: Doristine Locks , MD Date of Birth: 1970/09/28 CSN: [id number] Age: 48 Admit Type: Inpatient Procedure:                Upper GI endoscopy Indications:              Iron deficiency anemia, Heme positive stool, Melena Providers:                Doristine Locks, MD, Margaree Mackintosh, RN,                            Janie Billups, Technician, Roni Bread, CRNA Referring MD:              Medicines:                Monitored Anesthesia Care Complications:            No immediate complications. Estimated Blood Loss:     Estimated blood loss was minimal. Procedure:                Pre-Anesthesia Assessment:                           - Prior to the procedure, a History and Physical                            was performed, and patient medications and                            allergies were reviewed. The patient's tolerance of                            previous anesthesia was also reviewed. The risks                            and benefits of the procedure and the sedation                            options and risks were discussed with the patient.                            All questions were answered, and informed consent                            was obtained. Prior Anticoagulants: The patient has                            taken no previous anticoagulant or antiplatelet                            agents. ASA Grade Assessment: III - A patient with                            severe systemic disease. After reviewing the risks  and benefits, the patient was deemed in                            satisfactory condition to undergo the procedure.                           After obtaining informed consent, the endoscope was                            passed under direct vision. Throughout the                            procedure, the patient's blood  pressure, pulse, and                            oxygen saturations were monitored continuously. The                            GIF-H190 ([id number] Olympus gastroscope was                            introduced through the mouth, and advanced to the                            third part of duodenum. The upper GI endoscopy was                            accomplished without difficulty. The patient                            tolerated the procedure fairly well. Scope In: Scope Out: Findings:      LA Grade A (one or more mucosal breaks less than 5 mm, not extending       between tops of 2 mucosal folds) esophagitis with no bleeding was found       in the lower third of the esophagus.      The upper third of the esophagus and middle third of the esophagus were       normal.      Multiple non-bleeding cratered and linear gastric ulcers with no       stigmata of bleeding were found at the incisura, in the gastric antrum       and at the pylorus. The largest lesion was 8 mm in largest dimension.       Biopsies were taken with a cold forceps for histology. Estimated blood       loss was minimal.      Diffuse moderate inflammation characterized by congestion (edema) and       erythema was found in the cardia, in the gastric fundus and in the       gastric body. Biopsies were taken with a cold forceps for histology.       Estimated blood loss was minimal.      Diffuse prominent gastric folds were found in the gastric fundus and in       the gastric body. Biopsies were taken with a cold forceps for histology.       Estimated  blood loss was minimal.      Localized mildly erythematous mucosa without active bleeding and with no       stigmata of bleeding was found in the duodenal bulb. Biopsies were taken       with a cold forceps for histology. Estimated blood loss was minimal.      Diffuse scalloped mucosa was found in the second portion of the duodenum       and in the third portion of the duodenum.  Biopsies for histology were       taken with a cold forceps for evaluation of celiac disease. Estimated       blood loss was minimal. Impression:               - LA Grade A reflux esophagitis.                           - Normal upper third of esophagus and middle third                            of esophagus.                           - Non-bleeding gastric ulcers with no stigmata of                            bleeding. Biopsied.                           - Gastritis. Biopsied.                           - Enlarged gastric folds. Biopsied.                           - Erythematous duodenopathy. Biopsied.                           - Scalloped mucosa was found in the duodenum.                            Biopsied. Moderate Sedation:      Not Applicable - Patient had care per Anesthesia. Recommendation:           - Return patient to hospital ward for ongoing care.                           - Full liquid diet today.                           - Continue present medications.                           - Await pathology results.                           - Use Protonix (pantoprazole) 40 mg PO BID for 8  weeks.                           - Repeat EGD in 8 weeks to evaluate for mucosal                            healing.                           - If today's gastric biopsies are unrevealing, and                            still with prominent gastric folds on repeat EGD,                            will plan for deeper biopsies at that time (ie, cap                            assisted snare resection).                           - Colonoscopy as an outpatient for routine CRC                            screening. Can evaluate for additional lower GI                            etiologies for IDA at that time as well.                           - Continued antimicrobial therapy for cellulitis                            per primary service.                           - Cotinue  CIWA protocol.                           - Check for additional micronutrient deficiencies. Procedure Code(s):        --- Professional ---                           [phone], Esophagogastroduodenoscopy, flexible,                            transoral; with biopsy, single or multiple Diagnosis Code(s):        --- Professional ---                           K21.0, Gastro-esophageal reflux disease with                            esophagitis  K25.9, Gastric ulcer, unspecified as acute or                            chronic, without hemorrhage or perforation                           K29.70, Gastritis, unspecified, without bleeding                           K29.60, Other gastritis without bleeding                           K31.89, Other diseases of stomach and duodenum                           D50.9, Iron deficiency anemia, unspecified                           R19.5, Other fecal abnormalities                           K92.1, Melena (includes Hematochezia) CPT copyright 2018 [employer]. All rights reserved. The codes documented in this report are preliminary and upon coder review may  be revised to meet current compliance requirements. Doristine Locks, MD 07/24/2018 3:36:59 PM Number of Addenda: 0

## 2018-07-24 NOTE — Transfer of Care (Signed)
Immediate Anesthesia Transfer of Care Note  Patient: Dillon Schaefer  Procedure(s) Performed: ESOPHAGOGASTRODUODENOSCOPY (EGD) WITH PROPOFOL (N/A ) BIOPSY  Patient Location: PACU  Anesthesia Type:MAC  Level of Consciousness: awake, alert  and oriented  Airway & Oxygen Therapy: Patient Spontanous Breathing and Patient connected to nasal cannula oxygen  Post-op Assessment: Report given to RN and Post -op Vital signs reviewed and stable  Post vital signs: Reviewed and stable  Last Vitals:  Vitals Value Taken Time  BP 139/90 07/24/2018  3:23 PM  Temp 37 C 07/24/2018  3:23 PM  Pulse 83 07/24/2018  3:23 PM  Resp 19 07/24/2018  3:23 PM  SpO2 90 % 07/24/2018  3:23 PM    Last Pain:  Vitals:   07/24/18 1523  TempSrc: Oral  PainSc: 9       Patients Stated Pain Goal: 0 (33/54/56 2563)  Complications: No apparent anesthesia complications

## 2018-07-24 NOTE — Progress Notes (Signed)
PT Cancellation Note  Patient Details Name: Dillon Schaefer MRN: [id number] DOB: 1971/03/25   Cancelled Treatment:    Reason Eval/Treat Not Completed: Patient at procedure or test/unavailable;     [facility] 07/24/2018, 1:17 PM

## 2018-07-24 NOTE — Anesthesia Preprocedure Evaluation (Addendum)
Anesthesia Evaluation  Patient identified by MRN, date of birth, ID band Patient awake    Reviewed: Allergy & Precautions, H&P , NPO status , Patient's Chart, lab work & pertinent test results, reviewed documented beta blocker date and time   Airway Mallampati: III  TM Distance: >3 FB Neck ROM: full    Dental no notable dental hx. (+) Poor Dentition, Chipped, Loose, Missing,    Pulmonary neg pulmonary ROS, Current Smoker,    Pulmonary exam normal breath sounds clear to auscultation       Cardiovascular Exercise Tolerance: Good negative cardio ROS   Rhythm:regular Rate:Normal     Neuro/Psych negative neurological ROS  negative psych ROS   GI/Hepatic negative GI ROS, Neg liver ROS, (+)     substance abuse  alcohol use,   Endo/Other  negative endocrine ROS  Renal/GU negative Renal ROS  negative genitourinary   Musculoskeletal   Abdominal   Peds  Hematology negative hematology ROS (+) Blood dyscrasia, anemia ,   Anesthesia Other Findings   Reproductive/Obstetrics negative OB ROS                            Anesthesia Physical Anesthesia Plan  ASA: III  Anesthesia Plan: MAC   Post-op Pain Management:    Induction: Intravenous  PONV Risk Score and Plan: 1 and Treatment may vary due to age or medical condition  Airway Management Planned: Nasal Cannula and Simple Face Mask  Additional Equipment:   Intra-op Plan:   Post-operative Plan:   Informed Consent: I have reviewed the patients History and Physical, chart, labs and discussed the procedure including the risks, benefits and alternatives for the proposed anesthesia with the patient or authorized representative who has indicated his/her understanding and acceptance.     Dental Advisory Given  Plan Discussed with: CRNA, Anesthesiologist and Surgeon  Anesthesia Plan Comments: ( )        Anesthesia Quick Evaluation

## 2018-07-24 NOTE — Interval H&P Note (Signed)
History and Physical Interval Note:  07/24/2018 1:55 PM  Dillon Schaefer  has presented today for surgery, with the diagnosis of iron deficiency anemia, melena.  The various methods of treatment have been discussed with the patient and family. After consideration of risks, benefits and other options for treatment, the patient has consented to  Procedure(s): ESOPHAGOGASTRODUODENOSCOPY (EGD) WITH PROPOFOL (N/A) as a surgical intervention.  The patient's history has been reviewed, patient examined, no change in status, stable for surgery.  I have reviewed the patient's chart and labs.  Questions were answered to the patient's satisfaction.     Verlin Dike Michon Kaczmarek

## 2018-07-24 NOTE — Evaluation (Signed)
Occupational Therapy Evaluation Patient Details Name: Dillon Schaefer MRN: [id number] DOB: 04/23/1971 Today's Date: 07/24/2018    History of Present Illness This 48 year old man was admitted with general body aches and R hand injury after a fall.  xray negative for fx.  H/O ETOH   Clinical Impression   Pt was admitted for the above. At baseline, he is independent.  Pt limited by pain/edema. Evaluation was limited to bed level due to pain.  Educated pt on HEP for edema; strengthening. Will continue to follow with the goals listed below.       Follow Up Recommendations  SNF(depending on progress)    Equipment Recommendations  (to be further assessed)    Recommendations for Other Services       Precautions / Restrictions Precautions Precautions: Fall Precaution Comments: c/o radiating pain across shoulders to neck and head.  c/o L foot pain also Restrictions Weight Bearing Restrictions: No      Mobility Bed Mobility               General bed mobility comments: pt declined due to pain  Transfers                 General transfer comment: declined due to pain    Balance                                           ADL either performed or assessed with clinical judgement   ADL Overall ADL's : Needs assistance/impaired Eating/Feeding: Maximal assistance(for drink when positioned optimally LUE)   Grooming: Wash/dry hands;Wash/dry face;Maximal assistance;Bed level                                 General ADL Comments: total A +2 LB due to bil weakness/pain/edema except as noted     Vision         Perception     Praxis      Pertinent Vitals/Pain Pain Assessment: 0-10 Pain Score: 10-Worst pain ever Pain Location: when trying to lift LUE     Hand Dominance Right   Extremity/Trunk Assessment Upper Extremity Assessment Upper Extremity Assessment: RUE deficits/detail;LUE deficits/detail RUE Deficits / Details: able to  move shoulder FF to 140; elbow and wrist.  Hand is swollen; fisting limited to less than 1/4.   LUE Deficits / Details: able to make a loose fist, wrist too painful to move and elbow WFLs. Unable to tolerate FF AAROM beyond 10 degrees; had shooting pain           Communication Communication Communication: No difficulties   Cognition Arousal/Alertness: Awake/alert Behavior During Therapy: WFL for tasks assessed/performed                                   General Comments: wfls for OT session; educated on need to get OOB, but pt not willing to do so at this time due to pain   General Comments       Exercises     Shoulder Instructions      Home Living  Prior Functioning/Environment Level of Independence: Independent                 OT Problem List: Decreased strength;Decreased range of motion;Decreased activity tolerance;Decreased coordination;Decreased knowledge of use of DME or AE;Decreased knowledge of precautions;Pain;Impaired UE functional use;Increased edema      OT Treatment/Interventions: Self-care/ADL training;Energy conservation;DME and/or AE instruction;Therapeutic exercise;Therapeutic activities;Balance training;Patient/family education;Cognitive remediation/compensation    OT Goals(Current goals can be found in the care plan section) Acute Rehab OT Goals Patient Stated Goal: less pain OT Goal Formulation: With patient Time For Goal Achievement: 08/07/18 Potential to Achieve Goals: Good ADL Goals Pt Will Perform Eating: (P) with set-up;with adaptive utensils;sitting;bed level Pt Will Perform Grooming: (P) with min assist;sitting;with adaptive equipment Pt Will Transfer to Toilet: (P) with min assist;bedside commode;stand pivot transfer Additional ADL Goal #1: (P) pt will perform bed mobility at min A level in preparation for adls  OT Frequency: Min 2X/week   Barriers to D/C:             Co-evaluation              AM-PAC OT "6 Clicks" Daily Activity     Outcome Measure Help from another person eating meals?: A Lot Help from another person taking care of personal grooming?: A Lot Help from another person toileting, which includes using toliet, bedpan, or urinal?: Total Help from another person bathing (including washing, rinsing, drying)?: Total Help from another person to put on and taking off regular upper body clothing?: Total Help from another person to put on and taking off regular lower body clothing?: Total 6 Click Score: 8   End of Session    Activity Tolerance: Patient limited by pain Patient left: in bed;with call bell/phone within reach  OT Visit Diagnosis: Pain Pain - Right/Left: (R hand; L shoulder/wrist/foot and neck)                Time: 1610-9604 OT Time Calculation (min): 22 min Charges:  OT General Charges $OT Visit: 1 Visit OT Evaluation $OT Eval Moderate Complexity: 1 Mod  Marica Otter, OTR/L Acute Rehabilitation Services [phone] [facility] pager [phone] office 07/24/2018  Dillon Schaefer 07/24/2018, 9:39 AM

## 2018-07-24 NOTE — Progress Notes (Signed)
PROGRESS NOTE    Dillon ShiSamuel Elicker  ZOX:[id number] DOB: [birth year] DOA: 07/22/2018 PCP: Patient, No Pcp Per   Brief Narrative:  Dillon Schaefer is a 48 y.o. male with medical history significant of  Duodenitis, EtOh abuse, Presented with   4 to 5-day history of pain across neck and shoulders with hot and cold chills at home with generalized body aches.he was also found to be anemic and required 1 unit of prbc transfusion.     Assessment & Plan:   Active Problems:   Alcohol abuse   Abnormal CXR   Dehydration   Melena   Symptomatic anemia   Fall at home, initial encounter   Leg swelling   Upper GI bleed   Hyponatremia   SIRS (systemic inflammatory response syndrome) (HCC)   Pica   Malnutrition of moderate degree  SIRS/ Fever, leukocytosis and tachycardia,  Probably secondary to cellulitis from the left ankle.  Respiratory panel negative, HIV is nonreactive   Blood cultures negative so far.  Urine analysis is negative Continue with IV vancomycin empirically.    Alcohol abuse:  - CIWA protocol.  Currently no signs of withdrawal   Acute anemia of blood loss probably GI source with his history of duodenitis:  Iron deficiency, iron level is 13. - s/p 1 unit of prbc transfusion.  - repeat H&H 8/29 - GI consulted and plan for EGD and colonoscopy in the next 24 hours -Continue with IV Protonix 40 mg twice daily    Multiple joint pain and swelling.  Differential include GOUT vs septic arthritis.  Appreciate orthopedics recommendations.  Monitor on colchicine. Lactic acid is 0.8, procalcitonin 0.93 and CRP is 30.2   Hyponatremia:  Repeat in am.    H/o of PICA:  Nutrition consult.   Polysubstance abuse UDS positive for tetrahydrocannabinol.  Counseled for cessation   Moderate malnutrition Nutrition consulted and recommendations given  DVT prophylaxis: SCD'S Code Status:  Full code.  Family Communication: none at bedside.  Disposition Plan: pending clinical  improvement.    Consultants:   Orthopedics  Gastroenterology.   Procedures: None.  Antimicrobials: vancomycin for cellulitis   Subjective: Reports pain is worse in the left ankle, no chest pain or sob.   Objective: Vitals:   07/24/18 1000 07/24/18 1100 07/24/18 1200 07/24/18 1252  BP: 121/79 129/70 138/84 121/85  Pulse:   72 74  Resp: 14 12 19 18   Temp:    98.4 F (36.9 C)  TempSrc:      SpO2: 94%  100% 100%  Weight:      Height:        Intake/Output Summary (Last 24 hours) at 07/24/2018 1335 Last data filed at 07/24/2018 1000 Gross per 24 hour  Intake 1478.09 ml  Output 1125 ml  Net 353.09 ml   Filed Weights   07/22/18 1524  Weight: 71.2 kg    Examination:  General exam: mild distress from pain in the joints Respiratory system: Clear to auscultation. Respiratory effort normal. Cardiovascular system: S1 & S2 heard, tachycardic.  Gastrointestinal system: Abdomen is soft NT nd bs+ Central nervous system: Alert and oriented. No focal neurological deficits. Extremities:  Left ankle swelling ,  Left wrist and left metacarpophalangeal joint swollen and tender.  Skin: No rashes, lesions or ulcers Psychiatry: Mood & affect appropriate.     Data Reviewed: I have personally reviewed following labs and imaging studies  CBC: Recent Labs  Lab 07/22/18 1526 07/23/18 0150 07/23/18 1526 07/24/18 0609  WBC 28.7* 27.5* 24.4* 21.0*  NEUTROABS 24.4*  --   --   --   HGB 7.3* 6.8* 8.3* 8.0*  HCT 26.3* 24.7* 29.5* 29.0*  MCV 68.1* 69.4* 71.6* 73.2*  PLT 259 253 287 330   Basic Metabolic Panel: Recent Labs  Lab 07/22/18 1526 07/23/18 1609 07/24/18 0609  NA 131* 132* 135  K 4.0 4.0 3.9  CL 96* 101 103  CO2 24 22 21*  GLUCOSE 116* 96 98  BUN CREATININE 0.73 0.67 0.66  CALCIUM 8.7* 8.4* 8.5*  MG  --  2.2  --   PHOS  --  4.0  --    GFR: Estimated Creatinine Clearance: 115 mL/min (by C-G formula based on SCr of 0.66 mg/dL). Liver Function  Tests: Recent Labs  Lab 07/22/18 1526 07/23/18 1609  AST 17 16  ALT 16 14  ALKPHOS 63 60  BILITOT 0.5 1.0  PROT 6.9 6.4*  ALBUMIN 2.9* 2.6*   No results for input(s): LIPASE, AMYLASE in the last 168 hours. No results for input(s): AMMONIA in the last 168 hours. Coagulation Profile: Recent Labs  Lab 07/23/18 0150  INR 1.0   Cardiac Enzymes: Recent Labs  Lab 07/23/18 0150  CKTOTAL 23*  TROPONINI <0.03   BNP (last 3 results) No results for input(s): PROBNP in the last 8760 hours. HbA1C: No results for input(s): HGBA1C in the last 72 hours. CBG: No results for input(s): GLUCAP in the last 168 hours. Lipid Profile: No results for input(s): CHOL, HDL, LDLCALC, TRIG, CHOLHDL, LDLDIRECT in the last 72 hours. Thyroid Function Tests: Recent Labs    07/23/18 1610  TSH 1.817   Anemia Panel: Recent Labs    07/23/18 1526  FERRITIN 74  TIBC 335  IRON 13*   Sepsis Labs: Recent Labs  Lab 07/22/18 1526 07/23/18 0150 07/24/18 0609  PROCALCITON  --  0.93  --   LATICACIDVEN 1.5 0.8 0.6    Recent Results (from the past 240 hour(s))  MRSA PCR Screening     Status: None   Collection Time: 07/23/18  1:14 AM  Result Value Ref Range Status   MRSA by PCR NEGATIVE NEGATIVE Final    Comment:        The GeneXpert MRSA Assay (FDA approved for NASAL specimens only), is one component of a comprehensive MRSA colonization surveillance program. It is not intended to diagnose MRSA infection nor to guide or monitor treatment for MRSA infections. Performed at [facility], [address] [place], [place] [address]   Respiratory Panel by PCR     Status: None   Collection Time: 07/23/18 10:49 AM  Result Value Ref Range Status   Adenovirus NOT DETECTED NOT DETECTED Final   Coronavirus 229E NOT DETECTED NOT DETECTED Final    Comment: (NOTE) The Coronavirus on the Respiratory Panel, DOES NOT test for the novel  Coronavirus (2019 nCoV)    Coronavirus HKU1  NOT DETECTED NOT DETECTED Final   Coronavirus NL63 NOT DETECTED NOT DETECTED Final   Coronavirus OC43 NOT DETECTED NOT DETECTED Final   Metapneumovirus NOT DETECTED NOT DETECTED Final   Rhinovirus / Enterovirus NOT DETECTED NOT DETECTED Final   Influenza A NOT DETECTED NOT DETECTED Final   Influenza B NOT DETECTED NOT DETECTED Final   Parainfluenza Virus 1 NOT DETECTED NOT DETECTED Final   Parainfluenza Virus 2 NOT DETECTED NOT DETECTED Final   Parainfluenza Virus 3 NOT DETECTED NOT DETECTED Final   Parainfluenza Virus 4 NOT DETECTED NOT DETECTED Final  Respiratory Syncytial Virus NOT DETECTED NOT DETECTED Final   Bordetella pertussis NOT DETECTED NOT DETECTED Final   Chlamydophila pneumoniae NOT DETECTED NOT DETECTED Final   Mycoplasma pneumoniae NOT DETECTED NOT DETECTED Final    Comment: Performed at [facility], [address], [place], [place] [address]         Radiology Studies: Dg Chest 2 View  Result Date: 07/22/2018 CLINICAL DATA:  48 year old male with flu like symptoms. Body aches and pain. Smoker. EXAM: CHEST - 2 VIEW COMPARISON:  None. FINDINGS: Lung volumes at the upper limits of normal. Mediastinal contours are within normal limits. Visualized tracheal air column is within normal limits. No pneumothorax, pleural effusion or confluent pulmonary opacity. Mild diffuse increased pulmonary interstitial markings. No acute osseous abnormality identified. Negative visible bowel gas pattern. IMPRESSION: Mild diffuse increased pulmonary interstitial markings which could be smoking related, but consider viral/atypical respiratory infection in this clinical setting. No pleural effusion. Electronically Signed   By: Odessa Fleming M.D.   On: 07/22/2018 16:46   Dg Wrist Complete Right  Result Date: 07/22/2018 CLINICAL DATA:  48 year old male with flu like symptoms. Body aches. Pain. Fall several days ago with right upper extremity pain and swelling. EXAM: RIGHT WRIST - COMPLETE 3+ VIEW  COMPARISON:  None. FINDINGS: Bone mineralization is within normal limits. Generalized soft tissue swelling. Distal radius and ulna are intact. Carpal bones appear intact and normally aligned. Chronic appearing degenerative spurring and mild fragmentation at the base of the 5th metacarpal. No acute fracture identified. IMPRESSION: Chronic appearing changes at the base of the 5th metacarpal. No acute fracture or dislocation identified. Electronically Signed   By: Odessa Fleming M.D.   On: 07/22/2018 16:48   Ct Head Wo Contrast  Result Date: 07/22/2018 CLINICAL DATA:  Pain across the neck and shoulders bilaterally. Hot and cold chills. EXAM: CT HEAD WITHOUT CONTRAST CT CERVICAL SPINE WITHOUT CONTRAST TECHNIQUE: Multidetector CT imaging of the head and cervical spine was performed following the standard protocol without intravenous contrast. Multiplanar CT image reconstructions of the cervical spine were also generated. COMPARISON:  None. FINDINGS: CT HEAD FINDINGS Brain: No evidence of acute infarction, hemorrhage, hydrocephalus, extra-axial collection or mass lesion/mass effect. Vascular: No hyperdense vessel or unexpected calcification. Skull: Normal. Negative for fracture or focal lesion. Sinuses/Orbits: No acute finding. Other: None. CT CERVICAL SPINE FINDINGS Alignment: Slight reversal cervical lordosis at C5-6 attributable to degenerative disc disease. Skull base and vertebrae: Intact skull base. Bone island noted of the C4 vertebral body on the left. No acute cervical spine fracture. Uncovertebral joint erosive osteoarthritis with subchondral erosive changes noted at C5-6 on the right. Jumped or perched facets. Soft tissues and spinal canal: No prevertebral fluid or swelling. No visible canal hematoma. Disc levels: Moderate disc flattening C5-6 with small posterior marginal osteophytes. No significant foraminal encroachment. Upper chest: Negative. Other: None IMPRESSION: 1. No acute intracranial abnormality. 2.  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 No acute cervical spine fracture. Electronically Signed   By: Tollie Eth M.D.   On: 07/22/2018 20:14   Ct Chest Wo Contrast  Result Date: 07/23/2018 CLINICAL DATA:  General body aches, pain across the shoulders for 5 days. Larey Seat several days ago. EXAM: CT CHEST WITHOUT CONTRAST TECHNIQUE: Multidetector CT imaging of the chest was performed following the standard protocol without IV contrast. COMPARISON:  None. FINDINGS: Cardiovascular: Evaluation of vascular structures is limited without  IV contrast material. Normal heart size. No pericardial effusion. Scattered coronary artery calcifications. Normal caliber thoracic aorta. Scattered aortic calcification. Mediastinum/Nodes: Esophagus is decompressed. No significant mediastinal lymphadenopathy. Moderately prominent axillary lymph nodes without pathologic enlargement, likely reactive. Lungs/Pleura: Emphysematous changes in the lungs. Mild dependent atelectasis. No airspace disease or consolidation is suggested. No pleural effusions. No pneumothorax. Airways are patent. Upper Abdomen: No acute abnormalities. Musculoskeletal: Normal alignment of the thoracic spine. No destructive bone lesions. IMPRESSION: No evidence of active pulmonary disease. Emphysematous changes in the lungs. Aortic Atherosclerosis (ICD10-I70.0) and Emphysema (ICD10-J43.9). Electronically Signed   By: Burman Nieves M.D.   On: 07/23/2018 01:01   Ct Cervical Spine Wo Contrast  Result Date: 07/22/2018 CLINICAL DATA:  Pain across the neck and shoulders bilaterally. Hot and cold chills. EXAM: CT HEAD WITHOUT CONTRAST CT CERVICAL SPINE WITHOUT CONTRAST TECHNIQUE: Multidetector CT imaging of the head and cervical spine was performed following the standard protocol without intravenous contrast. Multiplanar CT image reconstructions  of the cervical spine were also generated. COMPARISON:  None. FINDINGS: CT HEAD FINDINGS Brain: No evidence of acute infarction, hemorrhage, hydrocephalus, extra-axial collection or mass lesion/mass effect. Vascular: No hyperdense vessel or unexpected calcification. Skull: Normal. Negative for fracture or focal lesion. Sinuses/Orbits: No acute finding. Other: None. CT CERVICAL SPINE FINDINGS Alignment: Slight reversal cervical lordosis at C5-6 attributable to degenerative disc disease. Skull base and vertebrae: Intact skull base. Bone island noted of the C4 vertebral body on the left. No acute cervical spine fracture. Uncovertebral joint erosive osteoarthritis with subchondral erosive changes noted at C5-6 on the right. Jumped or perched facets. Soft tissues and spinal canal: No prevertebral fluid or swelling. No visible canal hematoma. Disc levels: Moderate disc flattening C5-6 with small posterior marginal osteophytes. No significant foraminal encroachment. Upper chest: Negative. Other: None IMPRESSION: 1. No acute intracranial abnormality. 2. 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 No acute cervical spine fracture. Electronically Signed   By: Tollie Eth M.D.   On: 07/22/2018 20:14   Dg Hand Complete Right  Result Date: 07/22/2018 CLINICAL DATA:  Initial evaluation for acute general right hand pain with swelling. EXAM: RIGHT HAND - COMPLETE 3+ VIEW COMPARISON:  None. FINDINGS: No acute fracture or dislocation. Chronic changes at the base of the right fifth metacarpal. Joint spaces otherwise maintained without evidence for significant degenerative or erosive arthropathy. No discrete osseous lesions. No soft tissue abnormality. IMPRESSION: 1. No acute osseous abnormality. 2. Chronic changes at the base of the right fifth metacarpal. Electronically Signed   By: Rise Mu M.D.    On: 07/22/2018 20:04   Dg Foot Complete Left  Result Date: 07/22/2018 CLINICAL DATA:  Initial evaluation for acute generalized left foot pain with swelling. EXAM: LEFT FOOT - COMPLETE 3+ VIEW COMPARISON:  None. FINDINGS: Chronic changes at the medial tarsometatarsal articulations. Joint spaces otherwise maintained without evidence for significant degenerative or erosive arthropathy. No acute fracture dislocation. No discrete osseous lesions. No soft tissue abnormality. IMPRESSION: 1. No acute osseous abnormality. 2. Chronic changes at the medial tarsometatarsal articulations. Electronically Signed   By: Rise Mu M.D.   On: 07/22/2018 20:07        Scheduled Meds:  [START ON 07/25/2018] Chlorhexidine Gluconate Cloth  6 each Topical Daily   colchicine  0.6 mg Oral BID   feeding supplement (ENSURE ENLIVE)  237 mL Oral BID BM   mouth rinse  15 mL Mouth  Rinse BID   multivitamin with minerals  1 tablet Oral Daily   pantoprazole (PROTONIX) IV  40 mg Intravenous Q12H   sodium chloride flush  3 mL Intravenous Once   Continuous Infusions:  iron dextran (INFED/DEXFERRUM) infusion     vancomycin Stopped (07/24/18 1008)     LOS: 1 day    Time spent: 33 minutes.     Kathlen Mody, MD [facility] Pager [phone]   If 7PM-7AM, please contact night-coverage [email or web address] Password [email or web address] 07/24/2018, 1:35 PM

## 2018-07-24 NOTE — Anesthesia Procedure Notes (Signed)
Date/Time: 07/24/2018 2:48 PM Performed by: Thornell Mule, CRNA Oxygen Delivery Method: Nasal cannula

## 2018-07-25 ENCOUNTER — Inpatient Hospital Stay (HOSPITAL_COMMUNITY): Payer: Self-pay

## 2018-07-25 DIAGNOSIS — I340 Nonrheumatic mitral (valve) insufficiency: Secondary | ICD-10-CM

## 2018-07-25 LAB — BASIC METABOLIC PANEL
Anion gap: 9 (ref 5–15)
BUN: 10 mg/dL (ref 6–20)
CO2: 23 mmol/L (ref 22–32)
Calcium: 8.5 mg/dL — ABNORMAL LOW (ref 8.9–10.3)
Chloride: 103 mmol/L (ref 98–111)
Creatinine, Ser: 0.64 mg/dL (ref 0.61–1.24)
GFR calc Af Amer: >60 mL/min (ref >60)
GFR calc non Af Amer: >60 mL/min (ref >60)
Glucose, Bld: 140 mg/dL — ABNORMAL HIGH (ref 70–99)
Potassium: 3.3 mmol/L — ABNORMAL LOW (ref 3.5–5.1)
SODIUM: 135 mmol/L (ref 135–145)

## 2018-07-25 LAB — CBC WITH DIFFERENTIAL/PLATELET
Abs Immature Granulocytes: 0.14 10*3/uL — ABNORMAL HIGH (ref 0.00–0.07)
BASOS PCT: 1 %
Basophils Absolute: 0.1 10*3/uL (ref 0.0–0.1)
Eosinophils Absolute: 0.1 10*3/uL (ref 0.0–0.5)
Eosinophils Relative: 1 %
HCT: 28.8 % — ABNORMAL LOW (ref 39.0–52.0)
Hemoglobin: 8.2 g/dL — ABNORMAL LOW (ref 13.0–17.0)
Immature Granulocytes: 1 %
Lymphocytes Relative: 7 %
Lymphs Abs: 1 10*3/uL (ref 0.7–4.0)
MCH: 20.6 pg — ABNORMAL LOW (ref 26.0–34.0)
MCHC: 28.5 g/dL — ABNORMAL LOW (ref 30.0–36.0)
MCV: 72.4 fL — ABNORMAL LOW (ref 80.0–100.0)
Monocytes Absolute: 1.9 10*3/uL — ABNORMAL HIGH (ref 0.1–1.0)
Monocytes Relative: 13 %
Neutro Abs: 12.2 10*3/uL — ABNORMAL HIGH (ref 1.7–7.7)
Neutrophils Relative %: 77 %
Platelets: 452 10*3/uL — ABNORMAL HIGH (ref 150–400)
RBC: 3.98 MIL/uL — ABNORMAL LOW (ref 4.22–5.81)
RDW: 23.9 % — ABNORMAL HIGH (ref 11.5–15.5)
WBC: 15.5 10*3/uL — AB (ref 4.0–10.5)
nRBC: 0 % (ref 0.0–0.2)

## 2018-07-25 LAB — ECHOCARDIOGRAM COMPLETE
Height: 73 in
Weight: 2512 oz

## 2018-07-25 LAB — ANTI-DNA ANTIBODY, DOUBLE-STRANDED: ds DNA Ab: 1 IU/mL (ref 0–9)

## 2018-07-25 LAB — RHEUMATOID FACTOR: Rheumatoid fact SerPl-aCnc: 17.6 IU/mL — ABNORMAL HIGH (ref 0.0–13.9)

## 2018-07-25 MED ORDER — METHYLPREDNISOLONE SODIUM SUCC 125 MG IJ SOLR
60.0000 mg | Freq: Once | INTRAMUSCULAR | Status: AC
  Administered 2018-07-25: 60 mg via INTRAVENOUS
  Filled 2018-07-25: qty 2

## 2018-07-25 NOTE — Progress Notes (Addendum)
CROSS COVER [facility] Subjective: Dillon Schaefer is a 48 year old male with a history of alcohol abuse iron deficiency anemia who presented to hospital with weakness and melena. He had an endoscopy done after admission and found to have LA grade a esophagitis in the distal esophagus with multiple nonbleeding gastric ulcers antrum enlarged gastric folds biopsies of which are pending. He claims he is doing well today and denies any active problems at this time he denies having any nausea vomiting abdominal pain is tolerating his diet well.  Objective: Vital signs in last 24 hours: Temp:  [98.2 F (36.8 C)-98.6 F (37 C)] 98.6 F (37 C) (03/14 0537) Pulse Rate:  [72-83] 73 (03/14 0537) Resp:  [15-20] 16 (03/14 0537) BP: (121-139)/(84-94) 123/84 (03/14 0537) SpO2:  [90 %-100 %] 100 % (03/14 0537) Last BM Date: 07/20/18(pt. reported. )  Intake/Output from previous day: 03/13 0701 - 03/14 0700 In: 1724.4 [I.V.:704; IV Piggyback:1020.4] Out: 1100 [Urine:1100] Intake/Output this shift: No intake/output data recorded.  General appearance: alert, cooperative, appears stated age, distracted and no distress Resp: clear to auscultation bilaterally Cardio: regular rate and rhythm, S1, S2 normal, no murmur, click, rub or gallop GI: soft, non-tender; bowel sounds normal; no masses,  no organomegaly  Lab Results: Recent Labs    07/23/18 0150 07/23/18 1526 07/24/18 0609  WBC 27.5* 24.4* 21.0*  HGB 6.8* 8.3* 8.0*  HCT 24.7* 29.5* 29.0*  PLT 253 287 330   BMET Recent Labs    07/22/18 1526 07/23/18 1609 07/24/18 0609  NA 131* 132* 135  K 4.0 4.0 3.9  CL 96* 101 103  CO2 24 22 21*  GLUCOSE 116* 96 98  BUN 18 12 11   CREATININE 0.73 0.67 0.66  CALCIUM 8.7* 8.4* 8.5*   LFT Recent Labs    07/23/18 1609  PROT 6.4*  ALBUMIN 2.6*  AST 16  ALT 14  ALKPHOS 60  BILITOT 1.0   PT/INR Recent Labs    07/23/18 0150  LABPROT 13.4  INR 1.0   Medications: I have reviewed the patient's  current medications.  Assessment/Plan: 40) 48 year old black male with a history of anemia noted to have gastric ulcers and LA grade a esophagitis in the distal esophagus on endoscopy done yesterday.  Await pathology results continue treated with PPIs. 2) Severe malnutrition secondary to alcohol abuse. 3) History of intermittent chronic constipation-he will benefit from Colace 100 mg 2 pills twice daily along with MiraLAX on a as needed basis as needed.  LOS: 2 days   Dillon Schaefer 07/25/2018, 11:03 AM

## 2018-07-25 NOTE — Evaluation (Signed)
Physical Therapy Evaluation Patient Details Name: Dillon Schaefer MRN: [id number] DOB: 01-Aug-1970 Today's Date: 07/25/2018   History of Present Illness  This 48 year old man was admitted with general body aches and R hand injury after a fall.  xray negative for fx.  H/O ETOH  Clinical Impression  Limited eval session today due to pt had just gotten up and walked to bathroom with nursing . Sitting on window bench and stated his mobility is slightly better today but hurts a lot to try to walk on all joints and his ankle, but mostly in the back of his neck and head with a constant headache as well. R hand still swollen and sore with very limited movement as well. Educated a lot at this time and walked back to bed with very little assistance needed just antalgic with his movement.  WE encourage OOB with nursing and will continue to assess needs while here as well.     Follow Up Recommendations No PT follow up(unless he doens't progress as I anticipate in the hospital )    Equipment Recommendations  None recommended by PT    Recommendations for Other Services       Precautions / Restrictions Precautions Precautions: Fall Precaution Comments: c/o radiating pain across shoulders to neck and head.  c/o L foot pain also Restrictions Weight Bearing Restrictions: No      Mobility  Bed Mobility Overal bed mobility: Modified Independent                Transfers Overall transfer level: Modified independent                  Ambulation/Gait Ambulation/Gait assistance: Min guard Gait Distance (Feet): 5 Feet Assistive device: None       General Gait Details: limited walking in room at this time because he had just walked to the bathroom with NT and some pain in joints and "hobling" per pt. He did hae to stand back up from the bench seat and get into bed while our session was limited.   Stairs            Wheelchair Mobility    Modified Rankin (Stroke Patients Only)        Balance                                             Pertinent Vitals/Pain Pain Assessment: 0-10 Pain Score: 7  Pain Location: really hurts when I try to walk but better today than yesterday in all my joints but my R ankle a lot and my R hand, and across the back of my shoulders and up back of my neck to the top of my head ending in  aheadaache  Pain Descriptors / Indicators: Aching;Sharp;Shooting Pain Intervention(s): Repositioned;Monitored during session    Home Living Family/patient expects to be discharged to:: Private residence(group housing (?) ) Living Arrangements: Alone                    Prior Function Level of Independence: Independent               Hand Dominance        Extremity/Trunk Assessment        Lower Extremity Assessment Lower Extremity Assessment: Generalized weakness(limited movment due to pain in joints and akle )       Communication  Communication: No difficulties  Cognition Arousal/Alertness: Awake/alert Behavior During Therapy: WFL for tasks assessed/performed Overall Cognitive Status: Within Functional Limits for tasks assessed                                        General Comments      Exercises     Assessment/Plan    PT Assessment Patient needs continued PT services  PT Problem List Decreased range of motion;Decreased activity tolerance;Decreased mobility       PT Treatment Interventions Gait training;Functional mobility training;Therapeutic activities;Therapeutic exercise    PT Goals (Current goals can be found in the Care Plan section)  Acute Rehab PT Goals Patient Stated Goal: less pain and to be able to move again naturally like before  PT Goal Formulation: With patient Time For Goal Achievement: 08/08/18 Potential to Achieve Goals: Good    Frequency Min 3X/week   Barriers to discharge        Co-evaluation               AM-PAC PT "6 Clicks" Mobility   Outcome Measure Help needed turning from your back to your side while in a flat bed without using bedrails?: None Help needed moving from lying on your back to sitting on the side of a flat bed without using bedrails?: None Help needed moving to and from a bed to a chair (including a wheelchair)?: None Help needed standing up from a chair using your arms (e.g., wheelchair or bedside chair)?: None Help needed to walk in hospital room?: A Little Help needed climbing 3-5 steps with a railing? : A Little 6 Click Score: 22    End of Session   Activity Tolerance: Patient tolerated treatment well Patient left: in bed(echo in room doing test when I left ) Nurse Communication: Mobility status PT Visit Diagnosis: Other abnormalities of gait and mobility (R26.89)    Time: 6160-7371 PT Time Calculation (min) (ACUTE ONLY): 28 min   Charges:   PT Evaluation $PT Eval Low Complexity: 1 Low PT Treatments $Therapeutic Activity: 8-22 mins        Marella Bile, PT Acute Rehabilitation Services Pager: [phone] Office: [phone] 07/25/2018   Marella Bile 07/25/2018, 6:28 PM

## 2018-07-25 NOTE — Progress Notes (Signed)
  Echocardiogram 2D Echocardiogram has been performed.  Dillon Schaefer L Androw 07/25/2018, 11:57 AM

## 2018-07-25 NOTE — Progress Notes (Signed)
PROGRESS NOTE    Dillon Schaefer  KYH:[id number] DOB: 12/08/1970 DOA: 07/22/2018 PCP: Patient, No Pcp Per   Brief Narrative:  Dillon Schaefer is a 48 y.o. male with medical history significant of  Duodenitis, EtOh abuse, Presented with   4 to 5-day history of pain across neck and shoulders with hot and cold chills at home with generalized body aches.he was also found to be anemic and required 1 unit of prbc transfusion.     Assessment & Plan:   Active Problems:   Alcohol abuse   Abnormal CXR   Dehydration   Melena   Symptomatic anemia   Fall at home, initial encounter   Leg swelling   Upper GI bleed   Hyponatremia   SIRS (systemic inflammatory response syndrome) (HCC)   Pica   Iron deficiency anemia   Malnutrition of moderate degree   Gastroesophageal reflux disease with esophagitis   Gastritis and gastroduodenitis   Gastric ulcer without hemorrhage or perforation  SIRS  Fever, leukocytosis and tachycardia,  Probably secondary to cellulitis from the left ankle.  Respiratory panel negative, HIV is nonreactive   Blood cultures negative so far.  Urine analysis is negative Continue with IV vancomycin empirically.    Alcohol abuse:  - CIWA protocol.  Currently no signs of withdrawal   Acute anemia of blood loss probably GI source with his history of duodenitis:  Iron deficiency, iron level is 13. - s/p 1 unit of prbc transfusion and IV iron infusion.  - repeat H&H 8/29 - GI consulted and underwent EGD showing multiple esophageal and gastric ulcers. Advanced diet.  -Continue with  Protonix 40 mg twice daily - appreciate GI recommendations.     Multiple joint pain and swelling.  Differential include GOUT vs septic arthritis vs viral etiology.  Appreciate orthopedics recommendations.  Colchicine , morphine is not helping.  Lactic acid is 0.8, procalcitonin 0.93 and CRP is 30.2, ESR is 72,  Get RF, anti cyclic citrul peptide antibody , parvo b19 viral serology and  hepatitis serology ordered.  IV solumedrol 60 mg ordered.     Neck pain and back pain:   CT neck showed Degenerative disc disease C5-6 with moderate disc flattening and small posterior marginal osteophytes. 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 No acute cervical spine Fracture.  will get CT thoracic spine without contrast for further evaluation.    Hyponatremia:  Resolved.    H/o of PICA:  Nutrition consult.   Polysubstance abuse UDS positive for tetrahydrocannabinol.  Counseled for cessation   Moderate malnutrition Nutrition consulted and recommendations given  DVT prophylaxis: SCD'S Code Status:  Full code.  Family Communication: none at bedside.  Disposition Plan: pending clinical improvement.    Consultants:   Orthopedics  Gastroenterology.   Procedures: None.  Antimicrobials: vancomycin for cellulitis   Subjective: Pain persistent in the hands and back . Requiring IV dilaudid.   Objective: Vitals:   07/24/18 1530 07/24/18 2045 07/25/18 0537 07/25/18 1505  BP: 134/90 (!) 132/94 123/84 (!) 130/93  Pulse: 74 78 73 83  Resp: _0 Temp:  98.2 F (36.8 C) 98.6 F (37 C)   TempSrc:      SpO2: 100% 97% 100% 100%  Weight:      Height:        Intake/Output Summary (Last 24 hours) at 07/25/2018 1903 Last data filed at 07/25/2018 1749 Gross per 24 hour  Intake 675.48 ml  Output 450 ml  Net 225.48 ml  Filed Weights   07/22/18 1524  Weight: 71.2 kg    Examination:  General exam: mild distress from pain in the joints Respiratory system: Clear to auscultation. Respiratory effort normal. Cardiovascular system: S1 & S2 heard, tachycardic.  Gastrointestinal system: Abdomen is soft NT nd bs+ Central nervous system: Alert and oriented. No focal neurological deficits. Extremities:  Left ankle swelling ,  Left wrist and left metacarpophalangeal joint swollen and tender.  Skin: No rashes, lesions or ulcers Psychiatry:  Mood & affect appropriate.     Data Reviewed: I have personally reviewed following labs and imaging studies  CBC: Recent Labs  Lab 07/22/18 1526 07/23/18 0150 07/23/18 1526 07/24/18 0609 07/25/18 1058  WBC 28.7* 27.5* 24.4* 21.0* 15.5*  NEUTROABS 24.4*  --   --   --  12.2*  HGB 7.3* 6.8* 8.3* 8.0* 8.2*  HCT 26.3* 24.7* 29.5* 29.0* 28.8*  MCV 68.1* 69.4* 71.6* 73.2* 72.4*  PLT 259 253 287 330 761*   Basic Metabolic Panel: Recent Labs  Lab 07/22/18 1526 07/23/18 1609 07/24/18 0609 07/25/18 1058  NA 131* 132* 135 135  K 4.0 4.0 3.9 3.3*  CL 96* 101 103 103  CO2 24 22 21* 23  GLUCOSE 116* 96 98 140*  BUN _0 CREATININE 0.73 0.67 0.66 0.64  CALCIUM 8.7* 8.4* 8.5* 8.5*  MG  --  2.2  --   --   PHOS  --  4.0  --   --    GFR: Estimated Creatinine Clearance: 115 mL/min (by C-G formula based on SCr of 0.64 mg/dL). Liver Function Tests: Recent Labs  Lab 07/22/18 1526 07/23/18 1609  AST 17 16  ALT 16 14  ALKPHOS 63 60  BILITOT 0.5 1.0  PROT 6.9 6.4*  ALBUMIN 2.9* 2.6*   No results for input(s): LIPASE, AMYLASE in the last 168 hours. No results for input(s): AMMONIA in the last 168 hours. Coagulation Profile: Recent Labs  Lab 07/23/18 0150  INR 1.0   Cardiac Enzymes: Recent Labs  Lab 07/23/18 0150  CKTOTAL 23*  TROPONINI <0.03   BNP (last 3 results) No results for input(s): PROBNP in the last 8760 hours. HbA1C: No results for input(s): HGBA1C in the last 72 hours. CBG: No results for input(s): GLUCAP in the last 168 hours. Lipid Profile: No results for input(s): CHOL, HDL, LDLCALC, TRIG, CHOLHDL, LDLDIRECT in the last 72 hours. Thyroid Function Tests: Recent Labs    07/23/18 1610  TSH 1.817   Anemia Panel: Recent Labs    07/23/18 1526  FERRITIN 74  TIBC 335  IRON 13*   Sepsis Labs: Recent Labs  Lab 07/22/18 1526 07/23/18 0150 07/24/18 0609  PROCALCITON  --  0.93  --   LATICACIDVEN 1.5 0.8 0.6    Recent Results (from the  past 240 hour(s))  Culture, blood (x 2)     Status: None (Preliminary result)   Collection Time: 07/23/18 12:02 AM  Result Value Ref Range Status   Specimen Description   Final    BLOOD LEFT HAND Performed at [facility] [address] [place], [place] [address]    Special Requests   Final    BOTTLES DRAWN AEROBIC AND ANAEROBIC Blood Culture adequate volume Performed at [facility] [address] [place], [place] [address]    Culture   Final    NO GROWTH 2 DAYS Performed at [facility] [address] [place], [place] [address]    Report  Status PENDING  Incomplete  MRSA PCR Screening     Status: None   Collection Time: 07/23/18  1:14 AM  Result Value Ref Range Status   MRSA by PCR NEGATIVE NEGATIVE Final    Comment:        The GeneXpert MRSA Assay (FDA approved for NASAL specimens only), is one component of a comprehensive MRSA colonization surveillance program. It is not intended to diagnose MRSA infection nor to guide or monitor treatment for MRSA infections. Performed at [facility], [place] [address] [place], [place] [address]   Culture, blood (x 2)     Status: None (Preliminary result)   Collection Time: 07/23/18  1:50 AM  Result Value Ref Range Status   Specimen Description   Final    BLOOD RIGHT ANTECUBITAL Performed at [facility] [address], [place], [place] [address]    Special Requests   Final    BOTTLES DRAWN AEROBIC AND ANAEROBIC Blood Culture results may not be optimal due to an excessive volume of blood received in culture bottles Performed at [facility] [address], [place], [place] [address]    Culture   Final    NO GROWTH 2 DAYS Performed at [facility] [address], [place], [place] [address]    Report Status PENDING  Incomplete  Respiratory Panel by PCR     Status: None   Collection Time: 07/23/18 10:49 AM  Result  Value Ref Range Status   Adenovirus NOT DETECTED NOT DETECTED Final   Coronavirus 229E NOT DETECTED NOT DETECTED Final    Comment: (NOTE) The Coronavirus on the Respiratory Panel, DOES NOT test for the novel  Coronavirus (2019 nCoV)    Coronavirus HKU1 NOT DETECTED NOT DETECTED Final   Coronavirus NL63 NOT DETECTED NOT DETECTED Final   Coronavirus OC43 NOT DETECTED NOT DETECTED Final   Metapneumovirus NOT DETECTED NOT DETECTED Final   Rhinovirus / Enterovirus NOT DETECTED NOT DETECTED Final   Influenza A NOT DETECTED NOT DETECTED Final   Influenza B NOT DETECTED NOT DETECTED Final   Parainfluenza Virus 1 NOT DETECTED NOT DETECTED Final   Parainfluenza Virus 2 NOT DETECTED NOT DETECTED Final   Parainfluenza Virus 3 NOT DETECTED NOT DETECTED Final   Parainfluenza Virus 4 NOT DETECTED NOT DETECTED Final   Respiratory Syncytial Virus NOT DETECTED NOT DETECTED Final   Bordetella pertussis NOT DETECTED NOT DETECTED Final   Chlamydophila pneumoniae NOT DETECTED NOT DETECTED Final   Mycoplasma pneumoniae NOT DETECTED NOT DETECTED Final    Comment: Performed at [facility], [address], [place], [place] [address]         Radiology Studies: No results found.      Scheduled Meds:  Chlorhexidine Gluconate Cloth  6 each Topical Daily   colchicine  0.6 mg Oral BID   feeding supplement (ENSURE ENLIVE)  237 mL Oral BID BM   mouth rinse  15 mL Mouth Rinse BID   multivitamin with minerals  1 tablet Oral Daily   pantoprazole (PROTONIX) IV  40 mg Intravenous Q12H   sodium chloride flush  3 mL Intravenous Once   Continuous Infusions:  vancomycin Stopped (07/25/18 1312)     LOS: 2 days    Time spent: 38 minutes.     Hosie Poisson, MD [facility] Pager [phone]   If 7PM-7AM, please contact night-coverage [email or web address] Password [facility] 07/25/2018, 7:03 PM

## 2018-07-25 NOTE — Plan of Care (Signed)
  Problem: Nutrition: Goal: Adequate nutrition will be maintained Outcome: Progressing   Problem: Elimination: Goal: Will not experience complications related to bowel motility Outcome: Progressing   Problem: Pain Managment: Goal: General experience of comfort will improve Outcome: Progressing   

## 2018-07-26 DIAGNOSIS — E440 Moderate protein-calorie malnutrition: Secondary | ICD-10-CM

## 2018-07-26 DIAGNOSIS — D509 Iron deficiency anemia, unspecified: Secondary | ICD-10-CM

## 2018-07-26 LAB — BASIC METABOLIC PANEL
Anion gap: 12 (ref 5–15)
BUN: 9 mg/dL (ref 6–20)
CO2: 23 mmol/L (ref 22–32)
Calcium: 8.7 mg/dL — ABNORMAL LOW (ref 8.9–10.3)
Chloride: 99 mmol/L (ref 98–111)
Creatinine, Ser: 0.7 mg/dL (ref 0.61–1.24)
GFR calc Af Amer: >60 mL/min (ref >60)
GFR calc non Af Amer: >60 mL/min (ref >60)
GLUCOSE: 119 mg/dL — AB (ref 70–99)
Potassium: 3.9 mmol/L (ref 3.5–5.1)
Sodium: 134 mmol/L — ABNORMAL LOW (ref 135–145)

## 2018-07-26 LAB — CBC
HCT: 28.4 % — ABNORMAL LOW (ref 39.0–52.0)
Hemoglobin: 7.8 g/dL — ABNORMAL LOW (ref 13.0–17.0)
MCH: 20.1 pg — ABNORMAL LOW (ref 26.0–34.0)
MCHC: 27.5 g/dL — ABNORMAL LOW (ref 30.0–36.0)
MCV: 73 fL — ABNORMAL LOW (ref 80.0–100.0)
Platelets: 544 10*3/uL — ABNORMAL HIGH (ref 150–400)
RBC: 3.89 MIL/uL — ABNORMAL LOW (ref 4.22–5.81)
RDW: 23.9 % — ABNORMAL HIGH (ref 11.5–15.5)
WBC: 22 10*3/uL — ABNORMAL HIGH (ref 4.0–10.5)
nRBC: 0 % (ref 0.0–0.2)

## 2018-07-26 LAB — CYCLIC CITRUL PEPTIDE ANTIBODY, IGG/IGA: CCP Antibodies IgG/IgA: 9 units (ref 0–19)

## 2018-07-26 LAB — GLUCOSE, CAPILLARY: Glucose-Capillary: 96 mg/dL (ref 70–99)

## 2018-07-26 MED ORDER — PREDNISONE 20 MG PO TABS
40.0000 mg | ORAL_TABLET | Freq: Every day | ORAL | Status: DC
  Administered 2018-07-27 – 2018-07-28 (×2): 40 mg via ORAL
  Filled 2018-07-26 (×2): qty 2

## 2018-07-26 MED ORDER — METHYLPREDNISOLONE SODIUM SUCC 125 MG IJ SOLR
60.0000 mg | Freq: Once | INTRAMUSCULAR | Status: AC
  Administered 2018-07-26: 60 mg via INTRAVENOUS
  Filled 2018-07-26: qty 2

## 2018-07-26 MED ORDER — DOXYCYCLINE HYCLATE 100 MG PO TABS
100.0000 mg | ORAL_TABLET | Freq: Two times a day (BID) | ORAL | Status: DC
  Administered 2018-07-26 – 2018-07-28 (×4): 100 mg via ORAL
  Filled 2018-07-26 (×4): qty 1

## 2018-07-26 MED ORDER — POLYETHYLENE GLYCOL 3350 17 G PO PACK
17.0000 g | PACK | Freq: Three times a day (TID) | ORAL | Status: DC
  Administered 2018-07-26 – 2018-07-28 (×6): 17 g via ORAL
  Filled 2018-07-26 (×7): qty 1

## 2018-07-26 NOTE — Anesthesia Postprocedure Evaluation (Signed)
Anesthesia Post Note  Patient: Dillon Schaefer  Procedure(s) Performed: ESOPHAGOGASTRODUODENOSCOPY (EGD) WITH PROPOFOL (N/A ) BIOPSY     Patient location during evaluation: PACU Anesthesia Type: MAC Level of consciousness: awake and alert Pain management: pain level controlled Vital Signs Assessment: post-procedure vital signs reviewed and stable Respiratory status: spontaneous breathing, nonlabored ventilation, respiratory function stable and patient connected to nasal cannula oxygen Cardiovascular status: stable and blood pressure returned to baseline Postop Assessment: no apparent nausea or vomiting Anesthetic complications: no    Last Vitals:  Vitals:   07/26/18 1118 07/26/18 1800  BP: (!) 133/96 131/86  Pulse: 60 63  Resp:  14  Temp: 36.8 C 37.2 C  SpO2: 100% 98%    Last Pain:  Vitals:   07/26/18 0545  TempSrc: Oral  PainSc:                  Selby Slovacek

## 2018-07-26 NOTE — Progress Notes (Signed)
PROGRESS NOTE    Dillon Schaefer  OAC:[id number] DOB: Jun 23, 1970 DOA: 07/22/2018 PCP: Patient, No Pcp Per   Brief Narrative:  Dillon Schaefer is a 48 y.o. male with medical history significant of  Duodenitis, EtOh abuse, Presented with   4 to 5-day history of pain across neck and shoulders with hot and cold chills at home with generalized body aches.he was also found to be anemic and required 1 unit of prbc transfusion.     Assessment & Plan:   Active Problems:   Alcohol abuse   Abnormal CXR   Dehydration   Melena   Symptomatic anemia   Fall at home, initial encounter   Leg swelling   Upper GI bleed   Hyponatremia   SIRS (systemic inflammatory response syndrome) (HCC)   Pica   Iron deficiency anemia   Malnutrition of moderate degree   Gastroesophageal reflux disease with esophagitis   Gastritis and gastroduodenitis   Gastric ulcer without hemorrhage or perforation  SIRS  Fever, leukocytosis and tachycardia,  Probably secondary to cellulitis from the left ankle.  Respiratory panel negative, HIV is nonreactive   Blood cultures negative so far.  Urine analysis is negative IV vancomycin transitioned to oral doxycycline.    Alcohol abuse:  - CIWA protocol.  Currently no signs of withdrawal   Acute anemia of blood loss probably GI source with his history of duodenitis:  Iron deficiency, iron level is 13. - s/p 1 unit of prbc transfusion and IV iron infusion.  - repeat H&H 8/29 - GI consulted and underwent EGD showing multiple esophageal and gastric ulcers. Advanced diet.  -Continue with  Protonix 40 mg twice daily - appreciate GI recommendations.     Multiple joint pain and swelling.  Differential include GOUT vs septic arthritis vs viral etiology.  Appreciate orthopedics recommendations.  Colchicine , morphine is not helping.  Lactic acid is 0.8, procalcitonin 0.93 and CRP is 30.2, ESR is 72,  Get RF, anti cyclic citrul peptide antibody , parvo b19 viral serology  and hepatitis serology ordered.  IV solumedrol 60 mg ordered.     Neck pain and back pain:   CT neck showed Degenerative disc disease C5-6 with moderate disc flattening and small posterior marginal osteophytes. 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 No acute cervical spine Fracture.   Hyponatremia:  Resolved.    H/o of PICA:  Nutrition consult.   Polysubstance abuse UDS positive for tetrahydrocannabinol.  Counseled for cessation   Moderate malnutrition Nutrition consulted and recommendations given.  Constipation:  Started on miralax.   DVT prophylaxis: SCD'S Code Status:  Full code.  Family Communication: none at bedside.  Disposition Plan: pending clinical improvement.    Consultants:   Orthopedics  Gastroenterology.   Procedures: None.  Antimicrobials: vancomycin for cellulitis   Subjective: Pain better with solumedrol.    Objective: Vitals:   07/25/18 2122 07/26/18 0545 07/26/18 1118 07/26/18 1800  BP: 133/79 118/81 (!) 133/96 131/86  Pulse: 88 69 60 63  Resp: '19 16  14  '$ Temp: 99.9 F (37.7 C) 98.3 F (36.8 C) 98.2 F (36.8 C) 99 F (37.2 C)  TempSrc: Oral Oral    SpO2: 99% 98% 100% 98%  Weight:      Height:        Intake/Output Summary (Last 24 hours) at 07/26/2018 1817 Last data filed at 07/26/2018 1300 Gross per 24 hour  Intake 860 ml  Output 1150 ml  Net -290 ml   [employer]  07/22/18 1524  Weight: 71.2 kg    Examination:  General exam: mild distress from pain in the joints Respiratory system: Clear to auscultation. Respiratory effort normal. No wheezing or rhonchi.  Cardiovascular system: S1 & S2 heard, RRR.  Gastrointestinal system: Abdomen is soft NT nd bs+ Central nervous system: Alert and oriented. No focal neurological deficits. Extremities:  Left ankle swelling ,  Left wrist and left metacarpophalangeal joint swollen and tender and improving.  Skin: No rashes, lesions or ulcers Psychiatry:  Mood & affect appropriate.     Data Reviewed: I have personally reviewed following labs and imaging studies  CBC: Recent Labs  Lab 07/22/18 1526 07/23/18 0150 07/23/18 1526 07/24/18 0609 07/25/18 1058 07/26/18 0549  WBC 28.7* 27.5* 24.4* 21.0* 15.5* 22.0*  NEUTROABS 24.4*  --   --   --  12.2*  --   HGB 7.3* 6.8* 8.3* 8.0* 8.2* 7.8*  HCT 26.3* 24.7* 29.5* 29.0* 28.8* 28.4*  MCV 68.1* 69.4* 71.6* 73.2* 72.4* 73.0*  PLT 259 253 287 330 452* 259*   Basic Metabolic Panel: Recent Labs  Lab 07/22/18 1526 07/23/18 1609 07/24/18 0609 07/25/18 1058 07/26/18 0549  NA 131* 132* 135 135 134*  K 4.0 4.0 3.9 3.3* 3.9  CL 96* 101 103 103 99  CO2 24 22 21* 23 23  GLUCOSE 116* 96 98 140* 119*  BUN '18 12 11 10 9  '$ CREATININE 0.73 0.67 0.66 0.64 0.70  CALCIUM 8.7* 8.4* 8.5* 8.5* 8.7*  MG  --  2.2  --   --   --   PHOS  --  4.0  --   --   --    GFR: Estimated Creatinine Clearance: 115 mL/min (by C-G formula based on SCr of 0.7 mg/dL). Liver Function Tests: Recent Labs  Lab 07/22/18 1526 07/23/18 1609  AST 17 16  ALT 16 14  ALKPHOS 63 60  BILITOT 0.5 1.0  PROT 6.9 6.4*  ALBUMIN 2.9* 2.6*   No results for input(s): LIPASE, AMYLASE in the last 168 hours. No results for input(s): AMMONIA in the last 168 hours. Coagulation Profile: Recent Labs  Lab 07/23/18 0150  INR 1.0   Cardiac Enzymes: Recent Labs  Lab 07/23/18 0150  CKTOTAL 23*  TROPONINI <0.03   BNP (last 3 results) No results for input(s): PROBNP in the last 8760 hours. HbA1C: No results for input(s): HGBA1C in the last 72 hours. CBG: Recent Labs  Lab 07/26/18 1112  GLUCAP 96   Lipid Profile: No results for input(s): CHOL, HDL, LDLCALC, TRIG, CHOLHDL, LDLDIRECT in the last 72 hours. Thyroid Function Tests: No results for input(s): TSH, T4TOTAL, FREET4, T3FREE, THYROIDAB in the last 72 hours. Anemia Panel: No results for input(s): VITAMINB12, FOLATE, FERRITIN, TIBC, IRON, RETICCTPCT in the last 72 hours.  Sepsis Labs: Recent Labs  Lab 07/22/18 1526 07/23/18 0150 07/24/18 0609  PROCALCITON  --  0.93  --   LATICACIDVEN 1.5 0.8 0.6    Recent Results (from the past 240 hour(s))  Culture, blood (x 2)     Status: None (Preliminary result)   Collection Time: 07/23/18 12:02 AM  Result Value Ref Range Status   Specimen Description   Final    BLOOD LEFT HAND Performed at [facility] [address] [place], [place] [address]    Special Requests   Final    BOTTLES DRAWN AEROBIC AND ANAEROBIC Blood Culture adequate volume Performed at [facility] [address] [place], [place] [address]  Culture   Final    NO GROWTH 3 DAYS Performed at [facility], [place] [address], [place], [place] [address]    Report Status PENDING  Incomplete  MRSA PCR Screening     Status: None   Collection Time: 07/23/18  1:14 AM  Result Value Ref Range Status   MRSA by PCR NEGATIVE NEGATIVE Final    Comment:        The GeneXpert MRSA Assay (FDA approved for NASAL specimens only), is one component of a comprehensive MRSA colonization surveillance program. It is not intended to diagnose MRSA infection nor to guide or monitor treatment for MRSA infections. Performed at [facility], [place] [address], [place], [place] [address]   Culture, blood (x 2)     Status: None (Preliminary result)   Collection Time: 07/23/18  1:50 AM  Result Value Ref Range Status   Specimen Description   Final    BLOOD RIGHT ANTECUBITAL Performed at [facility] [address] [place], [place] [address]    Special Requests   Final    BOTTLES DRAWN AEROBIC AND ANAEROBIC Blood Culture results may not be optimal due to an excessive volume of blood received in culture bottles Performed at [facility] [address], [place], [place] [address]    Culture   Final    NO GROWTH 3 DAYS Performed at [facility], [place]  [address] [place], [place] [address]    Report Status PENDING  Incomplete  Respiratory Panel by PCR     Status: None   Collection Time: 07/23/18 10:49 AM  Result Value Ref Range Status   Adenovirus NOT DETECTED NOT DETECTED Final   Coronavirus 229E NOT DETECTED NOT DETECTED Final    Comment: (NOTE) The Coronavirus on the Respiratory Panel, DOES NOT test for the novel  Coronavirus (2019 nCoV)    Coronavirus HKU1 NOT DETECTED NOT DETECTED Final   Coronavirus NL63 NOT DETECTED NOT DETECTED Final   Coronavirus OC43 NOT DETECTED NOT DETECTED Final   Metapneumovirus NOT DETECTED NOT DETECTED Final   Rhinovirus / Enterovirus NOT DETECTED NOT DETECTED Final   Influenza A NOT DETECTED NOT DETECTED Final   Influenza B NOT DETECTED NOT DETECTED Final   Parainfluenza Virus 1 NOT DETECTED NOT DETECTED Final   Parainfluenza Virus 2 NOT DETECTED NOT DETECTED Final   Parainfluenza Virus 3 NOT DETECTED NOT DETECTED Final   Parainfluenza Virus 4 NOT DETECTED NOT DETECTED Final   Respiratory Syncytial Virus NOT DETECTED NOT DETECTED Final   Bordetella pertussis NOT DETECTED NOT DETECTED Final   Chlamydophila pneumoniae NOT DETECTED NOT DETECTED Final   Mycoplasma pneumoniae NOT DETECTED NOT DETECTED Final    Comment: Performed at [facility], [address] [place], [place] [address]         Radiology Studies: No results found.      Scheduled Meds: . Chlorhexidine Gluconate Cloth  6 each Topical Daily  . colchicine  0.6 mg Oral BID  . doxycycline  100 mg Oral Q12H  . feeding supplement (ENSURE ENLIVE)  237 mL Oral BID BM  . mouth rinse  15 mL Mouth Rinse BID  . multivitamin with minerals  1 tablet Oral Daily  . pantoprazole (PROTONIX) IV  40 mg Intravenous Q12H  . polyethylene glycol  17 g Oral TID  . [START ON 07/27/2018] predniSONE  40 mg Oral QAC breakfast  . sodium chloride flush  3 mL Intravenous Once  Continuous Infusions:    LOS: 3 days    Time spent: 30 minutes.      Hosie Poisson, MD [facility] Pager [phone]   If 7PM-7AM, please contact night-coverage [email or web address] Password [facility] 07/26/2018, 6:17 PM

## 2018-07-26 NOTE — Progress Notes (Signed)
   07/26/18 1453  OT Visit Information  Last OT Received On 07/26/18  Assistance Needed +1  History of Present Illness This 48 year old man was admitted with general body aches and R hand injury after a fall.  xray negative for fx.  H/O ETOH  Precautions  Precautions Fall  Pain Assessment  Pain Score 5  Pain Location neck, across back  Pain Descriptors / Indicators Aching;Sore  Pain Intervention(s) Limited activity within patient's tolerance;Monitored during session;Premedicated before session;Repositioned  Cognition  Arousal/Alertness Awake/alert  Behavior During Therapy WFL for tasks assessed/performed  Overall Cognitive Status Within Functional Limits for tasks assessed  ADL  General ADL Comments Pt states he has been taking showers himself (seated in shower) with set up.  He has been able to don clothing with set up with the exception of gown as previously noted.  Simulated tying shoe.  Pt is able to do without difficulty  Bed Mobility  Overal bed mobility Modified Independent  Restrictions  Weight Bearing Restrictions No  Transfers  Overall transfer level Modified independent  Other Exercises  Other Exercises brought written HEP and pt worked through this.  He has a tendency to really focus on exercises and do a lot.  Educated to take rest breaks and not overdo it, especially squeezing tubing as he goes at it full force. Left theraputty to use on next visit, when we upgrade HEP as appropriate. Pt is able to follow program, only with cues for relaxing traps.  Pt moves head very little as it causes shooting pain  OT - End of Session  Activity Tolerance Patient tolerated treatment well  Patient left in bed;with call bell/phone within reach  OT Assessment/Plan  OT Visit Diagnosis Pain  Pain - Right/Left Left  Pain - part of body Shoulder  OT Frequency (ACUTE ONLY) Min 2X/week  Follow Up Recommendations  (likely none; medicaid potential--would benefit OP OT)  OT Equipment None  recommended by OT  AM-PAC OT "6 Clicks" Daily Activity Outcome Measure (Version 2)  Help from another person eating meals? 4  Help from another person taking care of personal grooming? 3  Help from another person toileting, which includes using toliet, bedpan, or urinal? 3  Help from another person bathing (including washing, rinsing, drying)? 3  Help from another person to put on and taking off regular upper body clothing? 3  Help from another person to put on and taking off regular lower body clothing? 3  6 Click Score 19  OT Goal Progression  Progress towards OT goals Goals met and updated - see care plan  Acute Rehab OT Goals  Time For Goal Achievement 08/09/18  Potential to Achieve Goals Good  ADL Goals  Additional ADL Goal #2 pt will be independent with written HEP for bil UE strengthening/edema management  OT Time Calculation  OT Start Time (ACUTE ONLY) 1355  OT Stop Time (ACUTE ONLY) 1413  OT Time Calculation (min) 18 min  OT General Charges  $OT Visit 1 Visit  OT Treatments  $Therapeutic Activity 8-22 mins  Lesle Chris, OTR/L Acute Rehabilitation Services [phone] [facility] pager [phone] office 07/26/2018

## 2018-07-26 NOTE — Progress Notes (Signed)
CROSS COVER [facility] Subjective: Since I last evaluated the patient, there has not been much change in his overall medical condition.  He continues to have problems with constipation has not had a bowel movement in 6 to 7 days.  He denies having any abdominal pain nausea vomiting melena hematochezia.  He was admitted for abdominal pain and endoscopy revealed gastric ulcers and LA grade 1 esophagitis.  He denies any problems in that regard.   Objective: Vital signs in last 24 hours: Temp:  [98.3 F (36.8 C)-99.9 F (37.7 C)] 98.3 F (36.8 C) (03/15 0545) Pulse Rate:  [69-88] 69 (03/15 0545) Resp:  [16-19] 16 (03/15 0545) BP: (118-133)/(79-93) 118/81 (03/15 0545) SpO2:  [98 %-100 %] 98 % (03/15 0545) Last BM Date: 07/23/18  Intake/Output from previous day: 03/14 0701 - 03/15 0700 In: 1175.5 [P.O.:120; IV Piggyback:1055.5] Out: 1150 [Urine:1150] Intake/Output this shift: Total I/O In: 120 [P.O.:120] Out: -   General appearance: alert, cooperative, appears stated age, no distress and pale Resp: clear to auscultation bilaterally Cardio: regular rate and rhythm, S1, S2 normal, no murmur, click, rub or gallop GI: soft, non-tender; bowel sounds normal; no masses,  no organomegaly  Lab Results: Recent Labs    07/24/18 0609 07/25/18 1058 07/26/18 0549  WBC 21.0* 15.5* 22.0*  HGB 8.0* 8.2* 7.8*  HCT 29.0* 28.8* 28.4*  PLT 330 452* 544*   BMET Recent Labs    07/24/18 0609 07/25/18 1058 07/26/18 0549  NA 135 135 134*  K 3.9 3.3* 3.9  CL 103 103 99  CO2 21* 23 23  GLUCOSE 98 140* 119*  BUN 11 10 9   CREATININE 0.66 0.64 0.70  CALCIUM 8.5* 8.5* 8.7*   LFT Recent Labs    07/23/18 1609  PROT 6.4*  ALBUMIN 2.6*  AST 16  ALT 14  ALKPHOS 60  BILITOT 1.0   Medications: I have reviewed the patient's current medications.  Assessment/Plan: 1) Severe anemia-IDA/GERD/-secondary to GI bleed from gastric ulcers and reflux esophagitis-PPIs. 2) Alcohol abuse. 3)  Malnutrition. 4) Chronic constipation will benefit from Colace and MiraLAX. 5) Joint pains and DDD-C5-6-being worked up.  LOS: 3 days   Dillon Schaefer 07/26/2018, 11:15 AM

## 2018-07-26 NOTE — Progress Notes (Addendum)
Occupational Therapy Treatment and Goal update Patient Details Name: Dillon Schaefer MRN: [id number] DOB: 04-27-1971 Today's Date: 07/26/2018    History of present illness This 48 year old man was admitted with general body aches and R hand injury after a fall.  xray negative for fx.  H/O ETOH   OT comments  Pt's edema and RUE use are much improved.   Goals upgraded today   Follow Up Recommendations  (likely none--medicaid potential.  would benefit from OP OT)    Equipment Recommendations  None recommended by OT    Recommendations for Other Services      Precautions / Restrictions Precautions Precautions: Fall Restrictions Weight Bearing Restrictions: No       Mobility Bed Mobility Overal bed mobility: Modified Independent HOB raised                Transfers                      Balance                                           ADL either performed or assessed with clinical judgement   ADL   Eating/Feeding: Set up(occasional)   Grooming: Wash/dry hands;Wash/dry face;Set up                                 General ADL Comments: improved use of R hand; educated on sequence for UB dressing. He has needed assistance for tying (and snaps if IV connected).  Pt crosses legs for LB.  Pt has showered himself with set up    Vision       Perception     Praxis      Cognition Arousal/Alertness: Awake/alert Behavior During Therapy: WFL for tasks assessed/performed Overall Cognitive Status: Within Functional Limits for tasks assessed                                          Exercises Exercises: Other exercises Other Exercises Other Exercises: worked through HEP:  scapula depression and retraction, LUE AAROM FF and AROM abd, keeping traps inactive; R hand squeezing ball, pinching tubing and finger/abd/add.  Continue elevation for min edema.  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    Pertinent Vitals/ Pain       Pain Score: 7  Pain Location: neck, across back Pain Descriptors / Indicators: Aching;Sore Pain Intervention(s): Limited activity within patient's tolerance;Monitored during session;Repositioned  Home Living                                          Prior Functioning/Environment              Frequency  Min 2X/week        Progress Toward Goals  OT Goals(current goals can now be found in the care plan section)  Progress towards OT goals: Progressing toward goals     Plan      Co-evaluation  AM-PAC OT "6 Clicks" Daily Activity     Outcome Measure   Help from another person eating meals?: A Little Help from another person taking care of personal grooming?: A Little Help from another person toileting, which includes using toliet, bedpan, or urinal?: A Little Help from another person bathing (including washing, rinsing, drying)?: A Little Help from another person to put on and taking off regular upper body clothing?: A Little Help from another person to put on and taking off regular lower body clothing?: A Little 6 Click Score: 16    End of Session    OT Visit Diagnosis: Pain Pain - Right/Left: Left Pain - part of body: Shoulder   Activity Tolerance Patient tolerated treatment well   Patient Left in bed;with call bell/phone within reach   Nurse Communication          Time: 2409-7353 OT Time Calculation (min): 24 min  Charges: OT General Charges $OT Visit: 1 Visit OT Treatments $Self Care: 8-22 mins $Therapeutic Exercise: 8-22 mins  Marica Otter, OTR/L Acute Rehabilitation Services [phone] [facility] pager [phone] office 07/26/2018   Dillon Schaefer 07/26/2018, 12:39 PM

## 2018-07-27 ENCOUNTER — Inpatient Hospital Stay (HOSPITAL_COMMUNITY): Payer: Self-pay

## 2018-07-27 ENCOUNTER — Encounter (HOSPITAL_COMMUNITY): Payer: Self-pay | Admitting: Gastroenterology

## 2018-07-27 LAB — CBC WITH DIFFERENTIAL/PLATELET
Abs Immature Granulocytes: 0.26 10*3/uL — ABNORMAL HIGH (ref 0.00–0.07)
Basophils Absolute: 0.1 10*3/uL (ref 0.0–0.1)
Basophils Relative: 0 %
EOS ABS: 0 10*3/uL (ref 0.0–0.5)
Eosinophils Relative: 0 %
HCT: 27.7 % — ABNORMAL LOW (ref 39.0–52.0)
Hemoglobin: 7.6 g/dL — ABNORMAL LOW (ref 13.0–17.0)
Immature Granulocytes: 1 %
Lymphocytes Relative: 9 %
Lymphs Abs: 2.1 10*3/uL (ref 0.7–4.0)
MCH: 20.2 pg — AB (ref 26.0–34.0)
MCHC: 27.4 g/dL — ABNORMAL LOW (ref 30.0–36.0)
MCV: 73.7 fL — ABNORMAL LOW (ref 80.0–100.0)
Monocytes Absolute: 1.5 10*3/uL — ABNORMAL HIGH (ref 0.1–1.0)
Monocytes Relative: 7 %
Neutro Abs: 18.5 10*3/uL — ABNORMAL HIGH (ref 1.7–7.7)
Neutrophils Relative %: 83 %
Platelets: 695 10*3/uL — ABNORMAL HIGH (ref 150–400)
RBC: 3.76 MIL/uL — ABNORMAL LOW (ref 4.22–5.81)
RDW: 24.8 % — ABNORMAL HIGH (ref 11.5–15.5)
WBC: 22.5 10*3/uL — ABNORMAL HIGH (ref 4.0–10.5)
nRBC: 0 % (ref 0.0–0.2)

## 2018-07-27 LAB — PARVOVIRUS B19 ANTIBODY, IGG AND IGM
Parovirus B19 IgG Abs: 0.4 index (ref 0.0–0.8)
Parovirus B19 IgM Abs: 0.1 index (ref 0.0–0.8)

## 2018-07-27 LAB — HEPATITIS PANEL, ACUTE
HCV Ab: 0.1 s/co ratio (ref 0.0–0.9)
HEP B S AG: NEGATIVE
Hep A IgM: NEGATIVE
Hep B C IgM: NEGATIVE

## 2018-07-27 MED ORDER — PANTOPRAZOLE SODIUM 40 MG PO TBEC
40.0000 mg | DELAYED_RELEASE_TABLET | Freq: Two times a day (BID) | ORAL | Status: DC
  Administered 2018-07-27 – 2018-07-28 (×3): 40 mg via ORAL
  Filled 2018-07-27 (×3): qty 1

## 2018-07-27 NOTE — Progress Notes (Signed)
PROGRESS NOTE    Dillon Schaefer  DJT:[id number] DOB: [birth year] DOA: 07/22/2018 PCP: Patient, No Pcp Per   Brief Narrative:  Dillon Schaefer is a 48 y.o. male with medical history significant of  Duodenitis, EtOh abuse, Presented with   4 to 5-day history of pain across neck and shoulders with hot and cold chills at home with generalized body aches.he was also found to be anemic and required 1 unit of prbc transfusion.     Assessment & Plan:   Active Problems:   Alcohol abuse   Abnormal CXR   Dehydration   Melena   Symptomatic anemia   Fall at home, initial encounter   Leg swelling   Upper GI bleed   Hyponatremia   SIRS (systemic inflammatory response syndrome) (HCC)   Pica   Iron deficiency anemia   Malnutrition of moderate degree   Gastroesophageal reflux disease with esophagitis   Gastritis and gastroduodenitis   Gastric ulcer without hemorrhage or perforation  SIRS  Fever, leukocytosis and tachycardia,  Probably secondary to cellulitis from the left ankle.  Respiratory panel negative, HIV is nonreactive   Blood cultures negative so far.  Urine analysis is negative IV vancomycin transitioned to oral doxycycline to complete the course.    Alcohol abuse:  - CIWA protocol.  Currently no signs of withdrawal   Acute anemia of blood loss probably GI source with his history of duodenitis:  Iron deficiency, iron level is 13. - s/p 1 unit of prbc transfusion and IV iron infusion.  - repeat H&H 8/29 - GI consulted and underwent EGD showing multiple esophageal and gastric ulcers. Advanced diet.  -Continue with  Protonix 40 mg twice daily - appreciate GI recommendations.     Multiple joint pain and swelling.  Differential include GOUT vs septic arthritis vs viral etiology.  Appreciate orthopedics recommendations.  Colchicine , morphine is not helping.  Lactic acid is 0.8, procalcitonin 0.93 and CRP is 30.2, ESR is 72,  Get RF, anti cyclic citrul peptide antibody ,  parvo b19 viral serology and hepatitis serology ordered. Results pending.  IV solumedrol 60 mg ordered and pt reports his joint pain and swelling has improved.  Transition to oral steroids.    Pt reports he is unable to lift his left arm and move his left shoulder since yesterday.  MRI of the shoulder ordered for further evaluation.     Neck pain and back pain:   CT neck showed Degenerative disc disease C5-6 with moderate disc flattening and small posterior marginal osteophytes. 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 No acute cervical spine Fracture.   Hyponatremia:  Resolved.    H/o of PICA:  Nutrition consult.   Polysubstance abuse UDS positive for tetrahydrocannabinol.  Counseled for cessation   Moderate malnutrition Nutrition consulted and recommendations given.  Constipation:  Started on miralax.   DVT prophylaxis: SCD'S Code Status:  Full code.  Family Communication: none at bedside.  Disposition Plan: pending clinical improvement.    Consultants:   Orthopedics  Gastroenterology.   Procedures: None.  Antimicrobials: vancomycin for cellulitis   Subjective: Pain better with  Steroids.  No chest pain or sob. Walking in the hallwasy.    Objective: Vitals:   07/26/18 1800 07/26/18 2047 07/27/18 0613 07/27/18 1518  BP: 131/86 128/80 118/79 126/88  Pulse: 63 76 62 78  Resp: '14 18 18 18  '$ Temp: 99 F (37.2 C) 98.9 F (37.2 C) 98.3 F (36.8 C) 98.4 F (36.9 C)  TempSrc:  Oral  Oral  SpO2: 98% 98% 96% 98%  Weight:      Height:       No intake or output data in the 24 hours ending 07/27/18 1611 Filed Weights   07/22/18 1524  Weight: 71.2 kg    Examination:  General exam: mild distress from pain in the joints Respiratory system: air entry fair  bilaterally  Cardiovascular system: S1 & S2 heard, RRR.  Gastrointestinal system: Abdomen is soft NT nd bs+ Central nervous system: Alert and oriented. No focal neurological  deficits. Extremities:  Left ankle swelling ,  Left wrist and left metacarpophalangeal joint swollen and tender and improving.  Skin: No rashes, lesions or ulcers Psychiatry: Mood & affect appropriate.     Data Reviewed: I have personally reviewed following labs and imaging studies  CBC: Recent Labs  Lab 07/22/18 1526  07/23/18 1526 07/24/18 0609 07/25/18 1058 07/26/18 0549 07/27/18 1021  WBC 28.7*   < > 24.4* 21.0* 15.5* 22.0* 22.5*  NEUTROABS 24.4*  --   --   --  12.2*  --  18.5*  HGB 7.3*   < > 8.3* 8.0* 8.2* 7.8* 7.6*  HCT 26.3*   < > 29.5* 29.0* 28.8* 28.4* 27.7*  MCV 68.1*   < > 71.6* 73.2* 72.4* 73.0* 73.7*  PLT 259   < > 287 330 452* 544* 695*   < > = values in this interval not displayed.   Basic Metabolic Panel: Recent Labs  Lab 07/22/18 1526 07/23/18 1609 07/24/18 0609 07/25/18 1058 07/26/18 0549  NA 131* 132* 135 135 134*  K 4.0 4.0 3.9 3.3* 3.9  CL 96* 101 103 103 99  CO2 24 22 21* 23 23  GLUCOSE 116* 96 98 140* 119*  BUN '18 12 11 10 9  '$ CREATININE 0.73 0.67 0.66 0.64 0.70  CALCIUM 8.7* 8.4* 8.5* 8.5* 8.7*  MG  --  2.2  --   --   --   PHOS  --  4.0  --   --   --    GFR: Estimated Creatinine Clearance: 115 mL/min (by C-G formula based on SCr of 0.7 mg/dL). Liver Function Tests: Recent Labs  Lab 07/22/18 1526 07/23/18 1609  AST 17 16  ALT 16 14  ALKPHOS 63 60  BILITOT 0.5 1.0  PROT 6.9 6.4*  ALBUMIN 2.9* 2.6*   No results for input(s): LIPASE, AMYLASE in the last 168 hours. No results for input(s): AMMONIA in the last 168 hours. Coagulation Profile: Recent Labs  Lab 07/23/18 0150  INR 1.0   Cardiac Enzymes: Recent Labs  Lab 07/23/18 0150  CKTOTAL 23*  TROPONINI <0.03   BNP (last 3 results) No results for input(s): PROBNP in the last 8760 hours. HbA1C: No results for input(s): HGBA1C in the last 72 hours. CBG: Recent Labs  Lab 07/26/18 1112  GLUCAP 96   Lipid Profile: No results for input(s): CHOL, HDL, LDLCALC, TRIG,  CHOLHDL, LDLDIRECT in the last 72 hours. Thyroid Function Tests: No results for input(s): TSH, T4TOTAL, FREET4, T3FREE, THYROIDAB in the last 72 hours. Anemia Panel: No results for input(s): VITAMINB12, FOLATE, FERRITIN, TIBC, IRON, RETICCTPCT in the last 72 hours. Sepsis Labs: Recent Labs  Lab 07/22/18 1526 07/23/18 0150 07/24/18 0609  PROCALCITON  --  0.93  --   LATICACIDVEN 1.5 0.8 0.6    Recent Results (from the past 240 hour(s))  Culture, blood (x 2)     Status: None (Preliminary result)   Collection Time: 07/23/18 12:02 AM  Result Value Ref Range Status   Specimen Description   Final    BLOOD LEFT HAND Performed at [facility] [address] [place], [place] [address]    Special Requests   Final    BOTTLES DRAWN AEROBIC AND ANAEROBIC Blood Culture adequate volume Performed at [facility] [address] [place], [place] [address]    Culture   Final    NO GROWTH 4 DAYS Performed at [facility], [place] [address], [place], [place] [address]    Report Status PENDING  Incomplete  MRSA PCR Screening     Status: None   Collection Time: 07/23/18  1:14 AM  Result Value Ref Range Status   MRSA by PCR NEGATIVE NEGATIVE Final    Comment:        The GeneXpert MRSA Assay (FDA approved for NASAL specimens only), is one component of a comprehensive MRSA colonization surveillance program. It is not intended to diagnose MRSA infection nor to guide or monitor treatment for MRSA infections. Performed at [facility], [place] [address], [place], [place] [address]   Culture, blood (x 2)     Status: None (Preliminary result)   Collection Time: 07/23/18  1:50 AM  Result Value Ref Range Status   Specimen Description   Final    BLOOD RIGHT ANTECUBITAL Performed at [facility] [address], [place], [place] [address]    Special Requests   Final    BOTTLES DRAWN AEROBIC AND ANAEROBIC Blood Culture  results may not be optimal due to an excessive volume of blood received in culture bottles Performed at [facility] [address], [place], [place] [address]    Culture   Final    NO GROWTH 4 DAYS Performed at [facility], [place] [address], [place], [place] [address]    Report Status PENDING  Incomplete  Respiratory Panel by PCR     Status: None   Collection Time: 07/23/18 10:49 AM  Result Value Ref Range Status   Adenovirus NOT DETECTED NOT DETECTED Final   Coronavirus 229E NOT DETECTED NOT DETECTED Final    Comment: (NOTE) The Coronavirus on the Respiratory Panel, DOES NOT test for the novel  Coronavirus (2019 nCoV)    Coronavirus HKU1 NOT DETECTED NOT DETECTED Final   Coronavirus NL63 NOT DETECTED NOT DETECTED Final   Coronavirus OC43 NOT DETECTED NOT DETECTED Final   Metapneumovirus NOT DETECTED NOT DETECTED Final   Rhinovirus / Enterovirus NOT DETECTED NOT DETECTED Final   Influenza A NOT DETECTED NOT DETECTED Final   Influenza B NOT DETECTED NOT DETECTED Final   Parainfluenza Virus 1 NOT DETECTED NOT DETECTED Final   Parainfluenza Virus 2 NOT DETECTED NOT DETECTED Final   Parainfluenza Virus 3 NOT DETECTED NOT DETECTED Final   Parainfluenza Virus 4 NOT DETECTED NOT DETECTED Final   Respiratory Syncytial Virus NOT DETECTED NOT DETECTED Final   Bordetella pertussis NOT DETECTED NOT DETECTED Final   Chlamydophila pneumoniae NOT DETECTED NOT DETECTED Final   Mycoplasma pneumoniae NOT DETECTED NOT DETECTED Final    Comment: Performed at [facility], [address], [place],  [address]         Radiology Studies: Mr Shoulder Left Wo Contrast  Result Date: 07/27/2018 CLINICAL DATA:  Left shoulder pain since a fall 8 days ago. EXAM: MRI OF THE LEFT SHOULDER WITHOUT CONTRAST TECHNIQUE: Multiplanar, multisequence MR imaging of the shoulder was performed. No intravenous  contrast was administered. COMPARISON:  None. FINDINGS: Rotator cuff:  Intact supraspinatus, infraspinatus, teres minor and subscapularis tendons. Muscles: There is edema around the muscles of the rotator cuff, most prominent around the supraspinatus muscle with some edema within the posterior proximal fibers of the supraspinatus muscle. Biceps long head: Properly located. The tendon either has a small longitudinal split tear or an accessory slip of the tendon at the level of the bicipital groove. Acromioclavicular Joint: Normal. Type 2 acromion. There is small amount of fluid in the subacromial/subdeltoid bursae. Glenohumeral Joint: Moderate glenohumeral joint effusion. No chondral defect. Labrum:  Intact. Bones: Focal bone contusion of the posterolateral aspect of the humeral head. Focal edema in the humeral head adjacent to the superior aspect of the bicipital groove. Other: None IMPRESSION: 1. Bone contusions of the humeral head. 2. Probable posttraumatic bursitis and posttraumatic glenohumeral joint effusion. 3. The labrum appears normal with no findings involving the labrum or glenoid to suggest transient anterior dislocation. 4. Strain of the proximal supraspinatus muscle. Electronically Signed   By: Lorriane Shire M.D.   On: 07/27/2018 14:43        Scheduled Meds:  Chlorhexidine Gluconate Cloth  6 each Topical Daily   colchicine  0.6 mg Oral BID   doxycycline  100 mg Oral Q12H   feeding supplement (ENSURE ENLIVE)  237 mL Oral BID BM   mouth rinse  15 mL Mouth Rinse BID   multivitamin with minerals  1 tablet Oral Daily   pantoprazole  40 mg Oral BID   polyethylene glycol  17 g Oral TID   predniSONE  40 mg Oral QAC breakfast   sodium chloride flush  3 mL Intravenous Once   Continuous Infusions:    LOS: 4 days    Time spent: 28 minutes.     Hosie Poisson, MD [facility] Pager [phone]   If 7PM-7AM, please contact night-coverage [email or web address] Password [email or web address] 07/27/2018, 4:11 PM

## 2018-07-27 NOTE — Progress Notes (Signed)
PHYSICAL THERAPY  observered pt amb in hallway with staff.  No AD.  No assistance other than stand by.  Functional distance.  RN reports pt is having neck pain.  Will attempt tp see later if needed.  Felecia Shelling  PTA Acute  Rehabilitation Services Pager      [phone] Office      [phone]

## 2018-07-28 DIAGNOSIS — K2970 Gastritis, unspecified, without bleeding: Secondary | ICD-10-CM

## 2018-07-28 DIAGNOSIS — K2990 Gastroduodenitis, unspecified, without bleeding: Secondary | ICD-10-CM

## 2018-07-28 LAB — CULTURE, BLOOD (ROUTINE X 2)
Culture: NO GROWTH
Culture: NO GROWTH
Special Requests: ADEQUATE

## 2018-07-28 LAB — CYCLIC CITRUL PEPTIDE ANTIBODY, IGG/IGA: CCP Antibodies IgG/IgA: 8 units (ref 0–19)

## 2018-07-28 MED ORDER — PREDNISONE 20 MG PO TABS: ORAL_TABLET | ORAL | 0 refills | Status: AC

## 2018-07-28 MED ORDER — DOXYCYCLINE HYCLATE 100 MG PO TABS: 100.0000 mg | ORAL_TABLET | Freq: Two times a day (BID) | ORAL | 0 refills | Status: AC

## 2018-07-28 MED ORDER — PREDNISONE 20 MG PO TABS: 40.0000 mg | ORAL_TABLET | Freq: Every day | ORAL | 0 refills | Status: DC

## 2018-07-28 MED ORDER — POLYETHYLENE GLYCOL 3350 17 G PO PACK: 17.0000 g | PACK | Freq: Three times a day (TID) | ORAL | 0 refills | Status: AC

## 2018-07-28 MED ORDER — PANTOPRAZOLE SODIUM 40 MG PO TBEC: 40.0000 mg | DELAYED_RELEASE_TABLET | Freq: Two times a day (BID) | ORAL | 0 refills | Status: AC

## 2018-07-28 MED ORDER — ADULT MULTIVITAMIN W/MINERALS CH: 1.0000 | ORAL_TABLET | Freq: Every day | ORAL | Status: AC

## 2018-07-28 MED ORDER — SODIUM CHLORIDE 0.9% IV SOLUTION: Freq: Once | INTRAVENOUS | Status: DC

## 2018-07-28 NOTE — Progress Notes (Signed)
Occupational Therapy Treatment Patient Details Name: Dillon Schaefer MRN: [id number] DOB: 03-01-1971 Today's Date: 07/28/2018    History of present illness This 48 year old man was admitted with general body aches and R hand injury after a fall.  xray negative for fx.  H/O ETOH   OT comments  PATIENT REPORTS HIS R UP IS GREATLY IMPROVED. PATIENT HAD FULL HAND FLEX/EXT AND FULL ROM THROUGHOUT R UE. PATIENT IS ABLE TO PERFORM ADLS USING R HAND WITHOUT AE. PATIENT WAS ED TO CONTINUE TO PERFORM HEP FOR R UE. PATIENT IS MOD I WITH ADLS TRANSFERS. PATIENT STATES HE IS D/C HOME TODAY AND FEELS THAT HE WILL BE ABLE TO CARE FOR HIMSELF.   Follow Up Recommendations       Equipment Recommendations       Recommendations for Other Services      Precautions / Restrictions Precautions Precautions: Fall       Mobility Bed Mobility                  Transfers Overall transfer level: Modified independent                    Balance                                           ADL either performed or assessed with clinical judgement   ADL Overall ADL's : Modified independent                                       General ADL Comments: PATIENT IS USING R UE ONLY.     Vision       Perception     Praxis      Cognition Arousal/Alertness: Awake/alert Behavior During Therapy: WFL for tasks assessed/performed Overall Cognitive Status: Within Functional Limits for tasks assessed                                          Exercises     Shoulder Instructions       General Comments      Pertinent Vitals/ Pain       Pain Assessment: 0-10 Pain Score: 4  Pain Location: L SHLD Pain Descriptors / Indicators: Aching;Sore Pain Intervention(s): Limited activity within patient's tolerance  Home Living                                          Prior Functioning/Environment              Frequency            Progress Toward Goals  OT Goals(current goals can now be found in the care plan section)  Progress towards OT goals: Progressing toward goals  Acute Rehab OT Goals Patient Stated Goal: GO HOME  Plan      Co-evaluation                 AM-PAC OT "6 Clicks" Daily Activity     Outcome Measure   Help from another person eating meals?: None Help from another person taking care  of personal grooming?: None Help from another person toileting, which includes using toliet, bedpan, or urinal?: None Help from another person bathing (including washing, rinsing, drying)?: None Help from another person to put on and taking off regular upper body clothing?: None Help from another person to put on and taking off regular lower body clothing?: None 6 Click Score: 24    End of Session        Activity Tolerance Patient tolerated treatment well   Patient Left in chair;with call bell/phone within reach   Nurse Communication (OK THERAPY)        Time: 2355-7322 OT Time Calculation (min): 23 min  Charges: OT General Charges $OT Visit: 1 Visit OT Treatments $Self Care/Home Management : 8-22 mins $Therapeutic Exercise: 8-22 mins  6 CLICKS   Dillon Schaefer 07/28/2018, 1:00 PM

## 2018-07-31 NOTE — Discharge Summary (Signed)
Physician Discharge Summary  Branch Pacitti FWY:[id number] DOB: January 01, 1971 DOA: 07/22/2018  PCP: Patient, No Pcp Per  Admit date: 07/22/2018 Discharge date: 3/17  /2020  Admitted From: Home.  Disposition: Home.   Recommendations for Outpatient Follow-up:  1. Follow up with PCP in 1-2 weeks 2. Please obtain BMP/CBC in one week Please follow up with rheumatology in 1 to 2 weeks.  Please follow up with Orthopedics as needed.   Discharge Condition: stable.  CODE STATU: full code.  Diet recommendation: Heart Healthy  Brief/Interim Summary: Dillon Schaefer a 48 y.o.malewith medical history significant of Duodenitis, EtOh abuse, Presented with4 to 5-day history of pain across neck and shoulders with hot and cold chills at home with generalized body aches.he was also found to be anemic and required 1 unit of prbc transfusion.   Discharge Diagnoses:  Active Problems:   Alcohol abuse   Abnormal CXR   Dehydration   Melena   Symptomatic anemia   Fall at home, initial encounter   Leg swelling   Upper GI bleed   Hyponatremia   SIRS (systemic inflammatory response syndrome) (HCC)   Pica   Iron deficiency anemia   Malnutrition of moderate degree   Gastroesophageal reflux disease with esophagitis   Gastritis and gastroduodenitis   Gastric ulcer without hemorrhage or perforation  SIRS  Fever, leukocytosis and tachycardia,  Probably secondary to cellulitis from the left ankle.  Respiratory panel negative, HIV is nonreactive   Blood cultures negative so far.  Urine analysis is negative IV vancomycin transitioned to oral doxycycline to complete the course.    Alcohol abuse:  - CIWA protocol.  Currently no signs of withdrawal   Acute anemia of blood loss probably GI source with his history of duodenitis:  Iron deficiency, iron level is 13. - s/p 1 unit of prbc transfusion and IV iron infusion.  - repeat H&H 8/29 - GI consulted and underwent EGD showing multiple  esophageal and gastric ulcers. Advanced diet.  -Continue with  Protonix 40 mg twice daily - appreciate GI recommendations.     Multiple joint pain and swelling.  Differential include GOUT vs septic arthritis vs viral etiology.  Appreciate orthopedics recommendations.  Lactic acid is 0.8, procalcitonin 0.93 and CRP is 30.2, ESR is 72,  Get RF, anti cyclic citrul peptide antibody , parvo b19 viral serology and hepatitis serology ordered and negative.  Transition to oral steroids.    Pt reports he is unable to lift his left arm and move his left shoulder. MRI of the shoulder ordered for further evaluation. Discussed with Dr Tamera Punt with ortho, suggested the MRI changes are chronic and steroid injection would not help his situation. Recommended outpatient follow up with orthopedics as needed.     Neck pain and back pain:   CT neck showed Degenerative disc disease C5-6 with moderate disc flattening and small posterior marginal osteophytes. 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 No acute cervical spine Fracture.   Hyponatremia:  Resolved.    H/o of PICA:  Nutrition consult.   Polysubstance abuse UDS positive for tetrahydrocannabinol.  Counseled for cessation   Moderate malnutrition Nutrition consulted and recommendations given.  Constipation:  Started on miralax.   Discharge Instructions  Discharge Instructions    Diet - low sodium heart healthy   Complete by:  As directed    Discharge instructions   Complete by:  As directed    Please follow up with PCP in one week.  Please follow up with rheumatology in  1 week   Increase activity slowly   Complete by:  As directed      Allergies as of 07/28/2018   No Known Allergies     Medication List    STOP taking these medications   amoxicillin 500 MG capsule Commonly known as:  AMOXIL   ibuprofen 200 MG tablet Commonly known as:  ADVIL,MOTRIN   naproxen 500 MG  tablet Commonly known as:  NAPROSYN     TAKE these medications   acetaminophen 325 MG tablet Commonly known as:  TYLENOL Take 650 mg by mouth every 4 (four) hours as needed for moderate pain or headache.   doxycycline 100 MG tablet Commonly known as:  VIBRA-TABS Take 1 tablet (100 mg total) by mouth every 12 (twelve) hours.   multivitamin with minerals Tabs tablet Take 1 tablet by mouth daily.   pantoprazole 40 MG tablet Commonly known as:  PROTONIX Take 1 tablet (40 mg total) by mouth 2 (two) times daily.   polyethylene glycol packet Commonly known as:  MIRALAX / GLYCOLAX Take 17 g by mouth 3 (three) times daily.   predniSONE 20 MG tablet Commonly known as:  DELTASONE Prednisone 40 mg daily for 3 days followed by  Prednisone 20 mg daily for 3 days.      Follow-up Information    Tania Ade, MD Follow up.   Specialty:  Orthopedic Surgery Why:  as needed for joint pain.  Contact information: [address] [place] [address] [phone]          No Known Allergies  Consultations:  None.    Procedures/Studies: Dg Chest 2 View  Result Date: 07/22/2018 CLINICAL DATA:  48 year old male with flu like symptoms. Body aches and pain. Smoker. EXAM: CHEST - 2 VIEW COMPARISON:  None. FINDINGS: Lung volumes at the upper limits of normal. Mediastinal contours are within normal limits. Visualized tracheal air column is within normal limits. No pneumothorax, pleural effusion or confluent pulmonary opacity. Mild diffuse increased pulmonary interstitial markings. No acute osseous abnormality identified. Negative visible bowel gas pattern. IMPRESSION: Mild diffuse increased pulmonary interstitial markings which could be smoking related, but consider viral/atypical respiratory infection in this clinical setting. No pleural effusion. Electronically Signed   By: Genevie Ann M.D.   On: 07/22/2018 16:46   Dg Wrist Complete Right  Result Date: 07/22/2018 CLINICAL  DATA:  48 year old male with flu like symptoms. Body aches. Pain. Fall several days ago with right upper extremity pain and swelling. EXAM: RIGHT WRIST - COMPLETE 3+ VIEW COMPARISON:  None. FINDINGS: Bone mineralization is within normal limits. Generalized soft tissue swelling. Distal radius and ulna are intact. Carpal bones appear intact and normally aligned. Chronic appearing degenerative spurring and mild fragmentation at the base of the 5th metacarpal. No acute fracture identified. IMPRESSION: Chronic appearing changes at the base of the 5th metacarpal. No acute fracture or dislocation identified. Electronically Signed   By: Genevie Ann M.D.   On: 07/22/2018 16:48   Ct Head Wo Contrast  Result Date: 07/22/2018 CLINICAL DATA:  Pain across the neck and shoulders bilaterally. Hot and cold chills. EXAM: CT HEAD WITHOUT CONTRAST CT CERVICAL SPINE WITHOUT CONTRAST TECHNIQUE: Multidetector CT imaging of the head and cervical spine was performed following the standard protocol without intravenous contrast. Multiplanar CT image reconstructions of the cervical spine were also generated. COMPARISON:  None. FINDINGS: CT HEAD FINDINGS Brain: No evidence of acute infarction, hemorrhage, hydrocephalus, extra-axial collection or mass lesion/mass effect. Vascular: No hyperdense vessel or unexpected  calcification. Skull: Normal. Negative for fracture or focal lesion. Sinuses/Orbits: No acute finding. Other: None. CT CERVICAL SPINE FINDINGS Alignment: Slight reversal cervical lordosis at C5-6 attributable to degenerative disc disease. Skull base and vertebrae: Intact skull base. Bone island noted of the C4 vertebral body on the left. No acute cervical spine fracture. Uncovertebral joint erosive osteoarthritis with subchondral erosive changes noted at C5-6 on the right. Jumped or perched facets. Soft tissues and spinal canal: No prevertebral fluid or swelling. No visible canal hematoma. Disc levels: Moderate disc flattening C5-6  with small posterior marginal osteophytes. No significant foraminal encroachment. Upper chest: Negative. Other: None IMPRESSION: 1. No acute intracranial abnormality. 2. 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 No acute cervical spine fracture. Electronically Signed   By: Ashley Royalty M.D.   On: 07/22/2018 20:14   Ct Chest Wo Contrast  Result Date: 07/23/2018 CLINICAL DATA:  General body aches, pain across the shoulders for 5 days. [place] several days ago. EXAM: CT CHEST WITHOUT CONTRAST TECHNIQUE: Multidetector CT imaging of the chest was performed following the standard protocol without IV contrast. COMPARISON:  None. FINDINGS: Cardiovascular: Evaluation of vascular structures is limited without IV contrast material. Normal heart size. No pericardial effusion. Scattered coronary artery calcifications. Normal caliber thoracic aorta. Scattered aortic calcification. Mediastinum/Nodes: Esophagus is decompressed. No significant mediastinal lymphadenopathy. Moderately prominent axillary lymph nodes without pathologic enlargement, likely reactive. Lungs/Pleura: Emphysematous changes in the lungs. Mild dependent atelectasis. No airspace disease or consolidation is suggested. No pleural effusions. No pneumothorax. Airways are patent. Upper Abdomen: No acute abnormalities. Musculoskeletal: Normal alignment of the thoracic spine. No destructive bone lesions. IMPRESSION: No evidence of active pulmonary disease. Emphysematous changes in the lungs. Aortic Atherosclerosis (ICD10-I70.0) and Emphysema (ICD10-J43.9). Electronically Signed   By: Lucienne Capers M.D.   On: 07/23/2018 01:01   Ct Cervical Spine Wo Contrast  Result Date: 07/22/2018 CLINICAL DATA:  Pain across the neck and shoulders bilaterally. Hot and cold chills. EXAM: CT HEAD WITHOUT CONTRAST CT CERVICAL SPINE WITHOUT CONTRAST  TECHNIQUE: Multidetector CT imaging of the head and cervical spine was performed following the standard protocol without intravenous contrast. Multiplanar CT image reconstructions of the cervical spine were also generated. COMPARISON:  None. FINDINGS: CT HEAD FINDINGS Brain: No evidence of acute infarction, hemorrhage, hydrocephalus, extra-axial collection or mass lesion/mass effect. Vascular: No hyperdense vessel or unexpected calcification. Skull: Normal. Negative for fracture or focal lesion. Sinuses/Orbits: No acute finding. Other: None. CT CERVICAL SPINE FINDINGS Alignment: Slight reversal cervical lordosis at C5-6 attributable to degenerative disc disease. Skull base and vertebrae: Intact skull base. Bone island noted of the C4 vertebral body on the left. No acute cervical spine fracture. Uncovertebral joint erosive osteoarthritis with subchondral erosive changes noted at C5-6 on the right. Jumped or perched facets. Soft tissues and spinal canal: No prevertebral fluid or swelling. No visible canal hematoma. Disc levels: Moderate disc flattening C5-6 with small posterior marginal osteophytes. No significant foraminal encroachment. Upper chest: Negative. Other: None IMPRESSION: 1. No acute intracranial abnormality. 2. 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 No acute cervical spine fracture. Electronically Signed   By: Ashley Royalty M.D.   On: 07/22/2018 20:14   Mr Shoulder Left Wo Contrast  Result Date: 07/27/2018 CLINICAL DATA:  Left shoulder pain since a fall 8 days ago. EXAM: MRI OF THE LEFT  SHOULDER WITHOUT CONTRAST TECHNIQUE: Multiplanar, multisequence MR imaging of the shoulder was performed. No intravenous contrast was administered. COMPARISON:  None. FINDINGS: Rotator cuff: Intact supraspinatus, infraspinatus, teres minor and subscapularis tendons. Muscles: There is edema  around the muscles of the rotator cuff, most prominent around the supraspinatus muscle with some edema within the posterior proximal fibers of the supraspinatus muscle. Biceps long head: Properly located. The tendon either has a small longitudinal split tear or an accessory slip of the tendon at the level of the bicipital groove. Acromioclavicular Joint: Normal. Type 2 acromion. There is small amount of fluid in the subacromial/subdeltoid bursae. Glenohumeral Joint: Moderate glenohumeral joint effusion. No chondral defect. Labrum:  Intact. Bones: Focal bone contusion of the posterolateral aspect of the humeral head. Focal edema in the humeral head adjacent to the superior aspect of the bicipital groove. Other: None IMPRESSION: 1. Bone contusions of the humeral head. 2. Probable posttraumatic bursitis and posttraumatic glenohumeral joint effusion. 3. The labrum appears normal with no findings involving the labrum or glenoid to suggest transient anterior dislocation. 4. Strain of the proximal supraspinatus muscle. Electronically Signed   By: Lorriane Shire M.D.   On: 07/27/2018 14:43   Dg Hand Complete Right  Result Date: 07/22/2018 CLINICAL DATA:  Initial evaluation for acute general right hand pain with swelling. EXAM: RIGHT HAND - COMPLETE 3+ VIEW COMPARISON:  None. FINDINGS: No acute fracture or dislocation. Chronic changes at the base of the right fifth metacarpal. Joint spaces otherwise maintained without evidence for significant degenerative or erosive arthropathy. No discrete osseous lesions. No soft tissue abnormality. IMPRESSION: 1. No acute osseous abnormality. 2. Chronic changes at the base of the right fifth metacarpal. Electronically Signed   By: Jeannine Boga M.D.   On: 07/22/2018 20:04   Dg Foot Complete Left  Result Date: 07/22/2018 CLINICAL DATA:  Initial evaluation for acute generalized left foot pain with swelling. EXAM: LEFT FOOT - COMPLETE 3+ VIEW COMPARISON:  None. FINDINGS:  Chronic changes at the medial tarsometatarsal articulations. Joint spaces otherwise maintained without evidence for significant degenerative or erosive arthropathy. No acute fracture dislocation. No discrete osseous lesions. No soft tissue abnormality. IMPRESSION: 1. No acute osseous abnormality. 2. Chronic changes at the medial tarsometatarsal articulations. Electronically Signed   By: Jeannine Boga M.D.   On: 07/22/2018 20:07      Subjective: No chest pain.   Discharge Exam: Vitals:   07/27/18 2003 07/28/18 0628  BP: 127/90 (!) 138/91  Pulse: 64 (!) 57  Resp: 17 19  Temp: 98.3 F (36.8 C) 98.5 F (36.9 C)  SpO2: 100% 97%   Vitals:   07/27/18 0613 07/27/18 1518 07/27/18 2003 07/28/18 0628  BP: 118/79 126/88 127/90 (!) 138/91  Pulse: 62 78 64 (!) 57  Resp: '18 18 17 19  '$ Temp: 98.3 F (36.8 C) 98.4 F (36.9 C) 98.3 F (36.8 C) 98.5 F (36.9 C)  TempSrc:  Oral    SpO2: 96% 98% 100% 97%  Weight:      Height:        General: Pt is alert, awake, not in acute distress Cardiovascular: RRR, S1/S2 +, no rubs, no gallops Respiratory: CTA bilaterally, no wheezing, no rhonchi Abdominal: Soft, NT, ND, bowel sounds + Extremities: no edema, no cyanosis    The results of significant diagnostics from this hospitalization (including imaging, microbiology, ancillary and laboratory) are listed below for reference.     Microbiology: Recent Results (from the past 240 hour(s))  Culture, blood (x 2)  Status: None   Collection Time: 07/23/18 12:02 AM  Result Value Ref Range Status   Specimen Description   Final    BLOOD LEFT HAND Performed at [facility], [place] [address], [place], [place] [address]    Special Requests   Final    BOTTLES DRAWN AEROBIC AND ANAEROBIC Blood Culture adequate volume Performed at [facility] [address] [place], [place] [address]    Culture   Final    NO GROWTH 5 DAYS Performed at [place], [place] [address], [place], [place] [address]    Report Status 07/28/2018 FINAL  Final  MRSA PCR Screening     Status: None   Collection Time: 07/23/18  1:14 AM  Result Value Ref Range Status   MRSA by PCR NEGATIVE NEGATIVE Final    Comment:        The GeneXpert MRSA Assay (FDA approved for NASAL specimens only), is one component of a comprehensive MRSA colonization surveillance program. It is not intended to diagnose MRSA infection nor to guide or monitor treatment for MRSA infections. Performed at [facility], [place] [address], [place], [place] [address]   Culture, blood (x 2)     Status: None   Collection Time: 07/23/18  1:50 AM  Result Value Ref Range Status   Specimen Description   Final    BLOOD RIGHT ANTECUBITAL Performed at [place] [address] [place], [place] [address]    Special Requests   Final    BOTTLES DRAWN AEROBIC AND ANAEROBIC Blood Culture results may not be optimal due to an excessive volume of blood received in culture bottles Performed at [facility] [address] [place], [place] [address]    Culture   Final    NO GROWTH 5 DAYS Performed at [facility], [place] [address] [place], [place] [address]    Report Status 07/28/2018 FINAL  Final  Respiratory Panel by PCR     Status: None   Collection Time: 07/23/18 10:49 AM  Result Value Ref Range Status   Adenovirus NOT DETECTED NOT DETECTED Final   Coronavirus 229E NOT DETECTED NOT DETECTED Final    Comment: (NOTE) The Coronavirus on the Respiratory Panel, DOES NOT test for the novel  Coronavirus (2019 nCoV)    Coronavirus HKU1 NOT DETECTED NOT DETECTED Final   Coronavirus NL63 NOT DETECTED NOT DETECTED Final   Coronavirus OC43 NOT DETECTED NOT DETECTED Final   Metapneumovirus NOT DETECTED NOT DETECTED Final   Rhinovirus / Enterovirus NOT DETECTED NOT DETECTED Final   Influenza A NOT DETECTED NOT DETECTED Final   Influenza B NOT  DETECTED NOT DETECTED Final   Parainfluenza Virus 1 NOT DETECTED NOT DETECTED Final   Parainfluenza Virus 2 NOT DETECTED NOT DETECTED Final   Parainfluenza Virus 3 NOT DETECTED NOT DETECTED Final   Parainfluenza Virus 4 NOT DETECTED NOT DETECTED Final   Respiratory Syncytial Virus NOT DETECTED NOT DETECTED Final   Bordetella pertussis NOT DETECTED NOT DETECTED Final   Chlamydophila pneumoniae NOT DETECTED NOT DETECTED Final   Mycoplasma pneumoniae NOT DETECTED NOT DETECTED Final    Comment: Performed at [facility], [place] [address], [place], [place] [address]     Labs: BNP (last 3 results) No results for input(s): BNP in the last 8760 hours. Basic Metabolic Panel: Recent Labs  Lab 07/25/18 1058 07/26/18 0549  NA 135 134*  K 3.3* 3.9  CL  103 99  CO2 23 23  GLUCOSE 140* 119*  BUN 10 9  CREATININE 0.64 0.70  CALCIUM 8.5* 8.7*   Liver Function Tests: No results for input(s): AST, ALT, ALKPHOS, BILITOT, PROT, ALBUMIN in the last 168 hours. No results for input(s): LIPASE, AMYLASE in the last 168 hours. No results for input(s): AMMONIA in the last 168 hours. CBC: Recent Labs  Lab 07/25/18 1058 07/26/18 0549 07/27/18 1021  WBC 15.5* 22.0* 22.5*  NEUTROABS 12.2*  --  18.5*  HGB 8.2* 7.8* 7.6*  HCT 28.8* 28.4* 27.7*  MCV 72.4* 73.0* 73.7*  PLT 452* 544* 695*   Cardiac Enzymes: No results for input(s): CKTOTAL, CKMB, CKMBINDEX, TROPONINI in the last 168 hours. BNP: Invalid input(s): POCBNP CBG: Recent Labs  Lab 07/26/18 1112  GLUCAP 96   D-Dimer No results for input(s): DDIMER in the last 72 hours. Hgb A1c No results for input(s): HGBA1C in the last 72 hours. Lipid Profile No results for input(s): CHOL, HDL, LDLCALC, TRIG, CHOLHDL, LDLDIRECT in the last 72 hours. Thyroid function studies No results for input(s): TSH, T4TOTAL, T3FREE, THYROIDAB in the last 72 hours.  Invalid input(s): FREET3 Anemia work up No results for input(s): VITAMINB12, FOLATE,  FERRITIN, TIBC, IRON, RETICCTPCT in the last 72 hours. Urinalysis    Component Value Date/Time   COLORURINE AMBER (A) 07/23/2018 0152   APPEARANCEUR HAZY (A) 07/23/2018 0152   LABSPEC 1.032 (H) 07/23/2018 0152   PHURINE 5.0 07/23/2018 0152   GLUCOSEU NEGATIVE 07/23/2018 0152   HGBUR NEGATIVE 07/23/2018 0152   BILIRUBINUR NEGATIVE 07/23/2018 0152   KETONESUR 5 (A) 07/23/2018 0152   PROTEINUR 100 (A) 07/23/2018 0152   NITRITE NEGATIVE 07/23/2018 0152   LEUKOCYTESUR NEGATIVE 07/23/2018 0152   Sepsis Labs Invalid input(s): PROCALCITONIN,  WBC,  LACTICIDVEN Microbiology Recent Results (from the past 240 hour(s))  Culture, blood (x 2)     Status: None   Collection Time: 07/23/18 12:02 AM  Result Value Ref Range Status   Specimen Description   Final    BLOOD LEFT HAND Performed at [facility], [place] [address], [place], [place] [address]    Special Requests   Final    BOTTLES DRAWN AEROBIC AND ANAEROBIC Blood Culture adequate volume Performed at [facility] [address], [place], [place] [address]    Culture   Final    NO GROWTH 5 DAYS Performed at [facility], [place] [address], [place], [place] [address]    Report Status 07/28/2018 FINAL  Final  MRSA PCR Screening     Status: None   Collection Time: 07/23/18  1:14 AM  Result Value Ref Range Status   MRSA by PCR NEGATIVE NEGATIVE Final    Comment:        The GeneXpert MRSA Assay (FDA approved for NASAL specimens only), is one component of a comprehensive MRSA colonization surveillance program. It is not intended to diagnose MRSA infection nor to guide or monitor treatment for MRSA infections. Performed at [facility], [place] [address] [place], [place] [address]   Culture, blood (x 2)     Status: None   Collection Time: 07/23/18  1:50 AM  Result Value Ref Range Status   Specimen Description   Final    BLOOD RIGHT ANTECUBITAL Performed at [facility] [address], [place], [place] [address]    Special Requests   Final    BOTTLES DRAWN AEROBIC AND ANAEROBIC Blood Culture results may  not be optimal due to an excessive volume of blood received in culture bottles Performed at [facility] [address] [place], [place] [address]    Culture   Final    NO GROWTH 5 DAYS Performed at [facility], [place] [address], [place], [place] [address]    Report Status 07/28/2018 FINAL  Final  Respiratory Panel by PCR     Status: None   Collection Time: 07/23/18 10:49 AM  Result Value Ref Range Status   Adenovirus NOT DETECTED NOT DETECTED Final   Coronavirus 229E NOT DETECTED NOT DETECTED Final    Comment: (NOTE) The Coronavirus on the Respiratory Panel, DOES NOT test for the novel  Coronavirus (2019 nCoV)    Coronavirus HKU1 NOT DETECTED NOT DETECTED Final   Coronavirus NL63 NOT DETECTED NOT DETECTED Final   Coronavirus OC43 NOT DETECTED NOT DETECTED Final   Metapneumovirus NOT DETECTED NOT DETECTED Final   Rhinovirus / Enterovirus NOT DETECTED NOT DETECTED Final   Influenza A NOT DETECTED NOT DETECTED Final   Influenza B NOT DETECTED NOT DETECTED Final   Parainfluenza Virus 1 NOT DETECTED NOT DETECTED Final   Parainfluenza Virus 2 NOT DETECTED NOT DETECTED Final   Parainfluenza Virus 3 NOT DETECTED NOT DETECTED Final   Parainfluenza Virus 4 NOT DETECTED NOT DETECTED Final   Respiratory Syncytial Virus NOT DETECTED NOT DETECTED Final   Bordetella pertussis NOT DETECTED NOT DETECTED Final   Chlamydophila pneumoniae NOT DETECTED NOT DETECTED Final   Mycoplasma pneumoniae NOT DETECTED NOT DETECTED Final    Comment: Performed at [facility], [place] [address], [place],  [address]     Time coordinating discharge: 32  minutes  SIGNED:   Hosie Poisson, MD  [facility] 07/31/2018, 9:27 AM Pager   If 7PM-7AM, please contact night-coverage [email or web address] Password [email or web address]

## 2018-08-13 ENCOUNTER — Other Ambulatory Visit: Payer: Self-pay

## 2018-08-13 MED ORDER — SOD PICOSULFATE-MAG OX-CIT ACD 10-3.5-12 MG-GM -GM/160ML PO SOLN: 1.0000 | ORAL | 0 refills | Status: AC

## 2018-09-22 ENCOUNTER — Telehealth: Payer: Self-pay | Admitting: *Deleted

## 2018-09-22 NOTE — Telephone Encounter (Signed)
Invalid number for the patient provided so unable to do the COVID 19 screening. SM

## 2018-09-23 ENCOUNTER — Encounter: Payer: Self-pay | Admitting: Gastroenterology

## 2019-11-24 IMAGING — MR MRI OF THE LEFT SHOULDER WITHOUT CONTRAST
4 of 6 series · 19 of 40 positions shown · non-contrast
Comparison: None.

CLINICAL DATA: Left shoulder pain since a fall 8 days ago.

EXAM:
MRI OF THE LEFT SHOULDER WITHOUT CONTRAST
TECHNIQUE: Multiplanar, multisequence MR imaging of the shoulder was performed.
No intravenous contrast was administered.

[Series 3: PD · axial · 4.0mm · 0.27mm/px · z∈[-57,+31]mm · 4 of 25 slices shown]
[im 1/25]
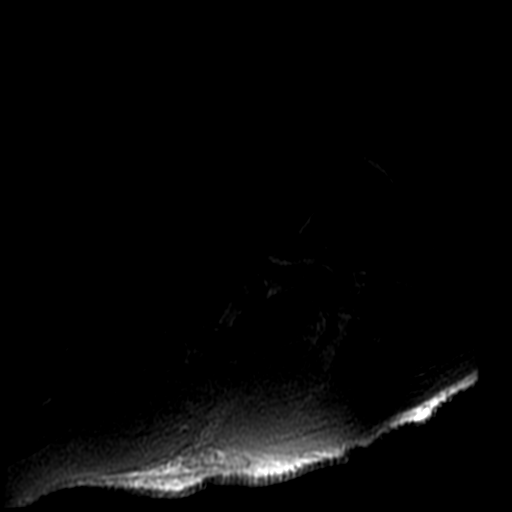
[im 3/25]
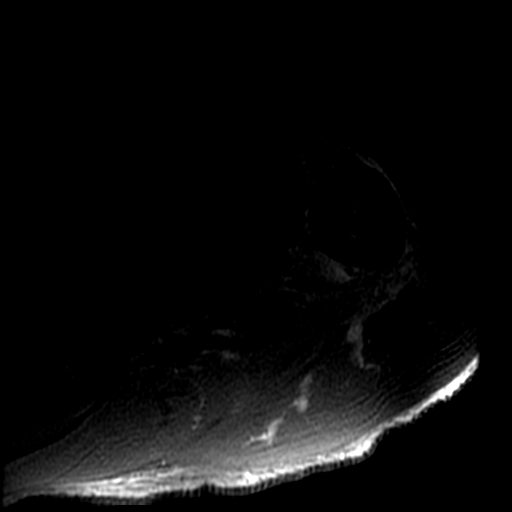
[im 14/25]
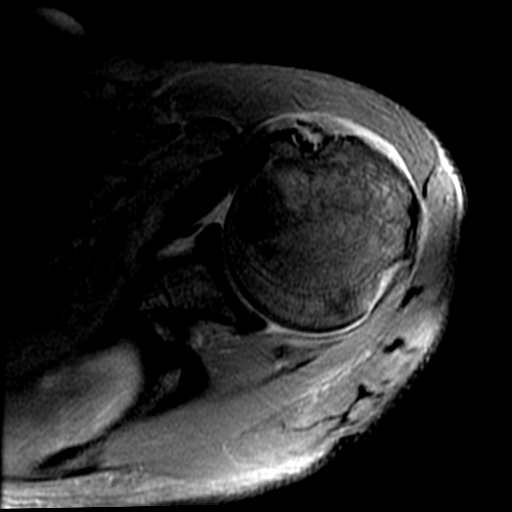
[im 22/25]
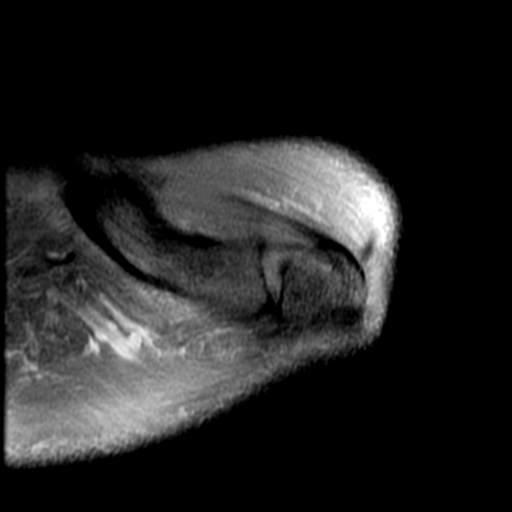

[Series 4: T2 fat-sat · oblique · 4.0mm · 0.27mm/px · 3 of 18 slices shown]
[im 4/18]
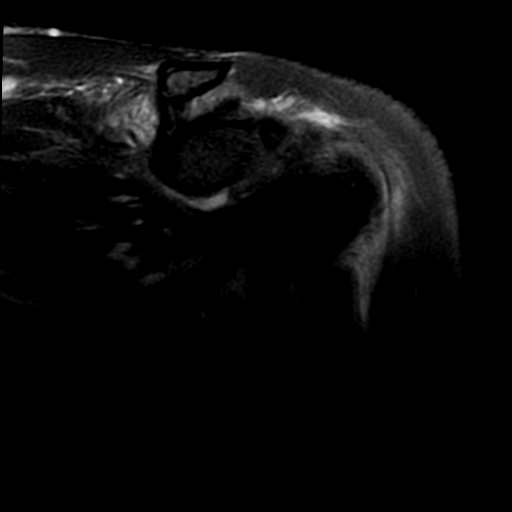
[im 11/18]
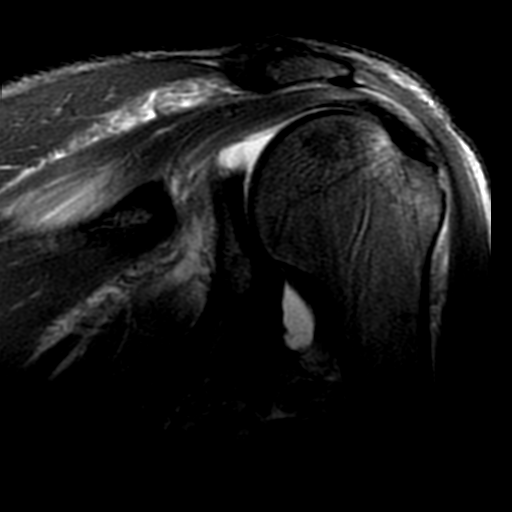
[im 18/18]
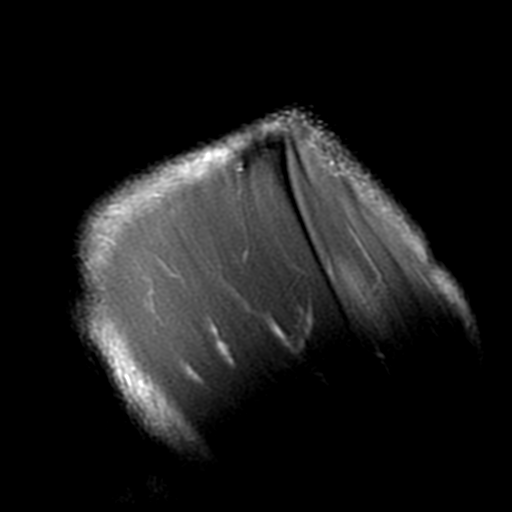

[Series 5: PD fat-sat · oblique · 4.0mm · 0.27mm/px · 6 of 18 slices shown (1 of 2)]
[im 1/18]
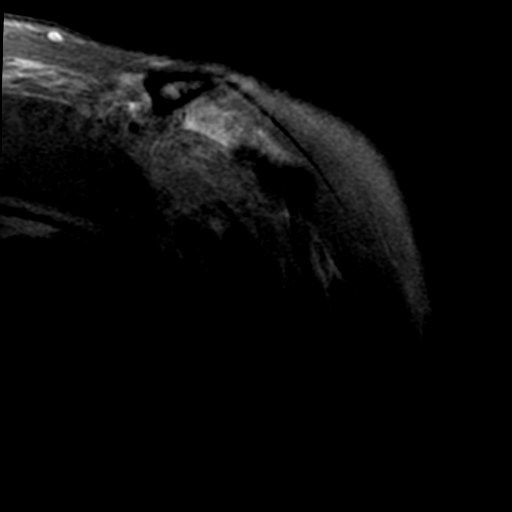
[im 4/18]
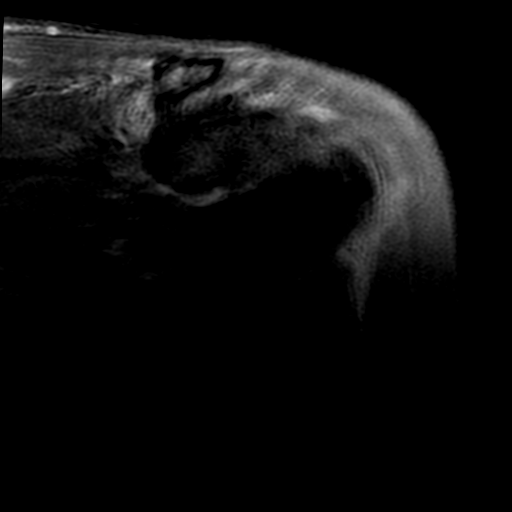
[im 7/18]
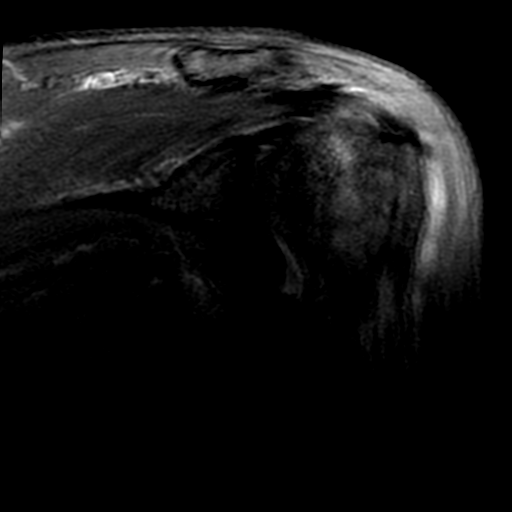
[im 11/18]
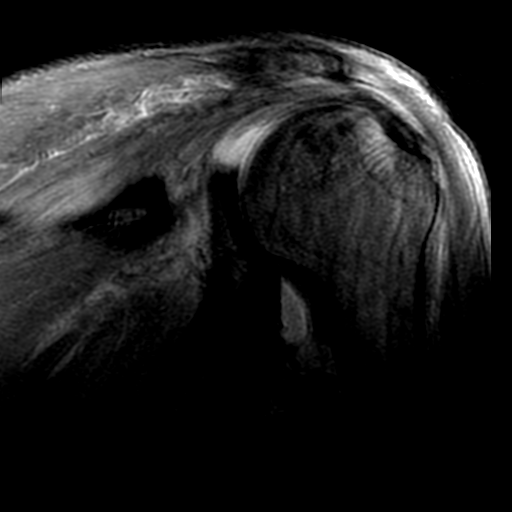
[im 14/18]
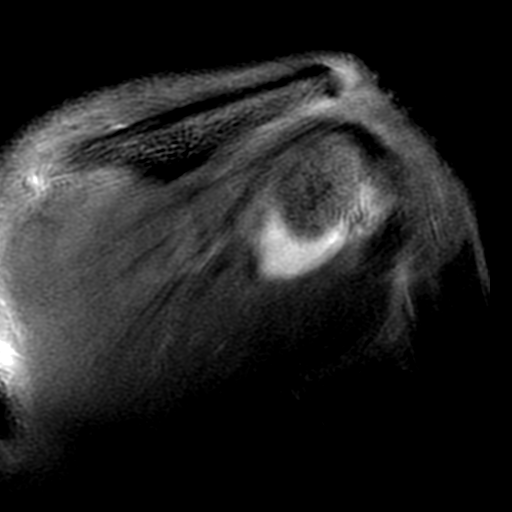
[im 18/18]
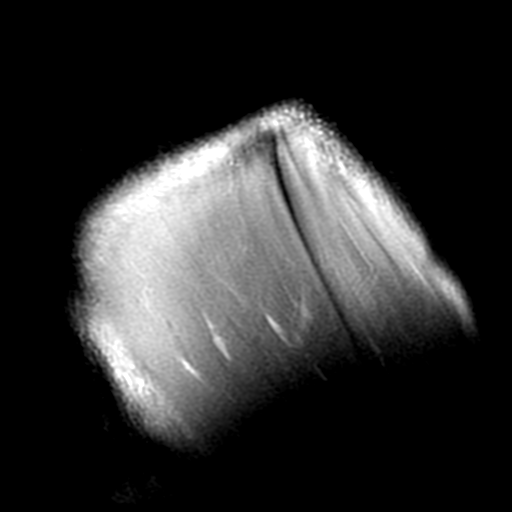

[Series 8: PD fat-sat · oblique · 4.0mm · 0.27mm/px · 6 of 18 slices shown (2 of 2)]
[im 1/18]
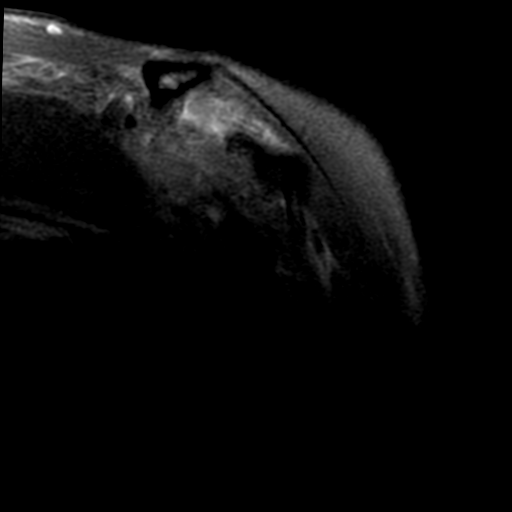
[im 4/18]
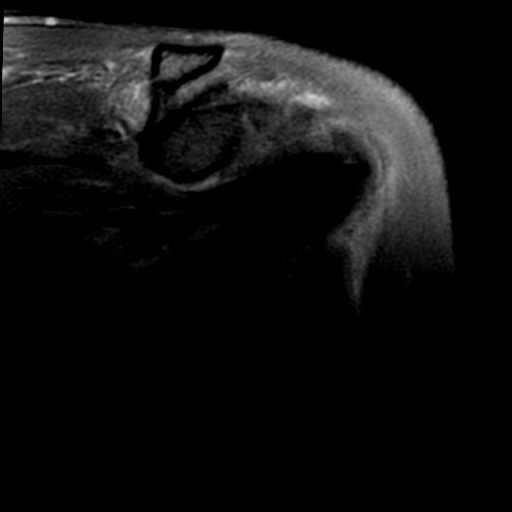
[im 7/18]
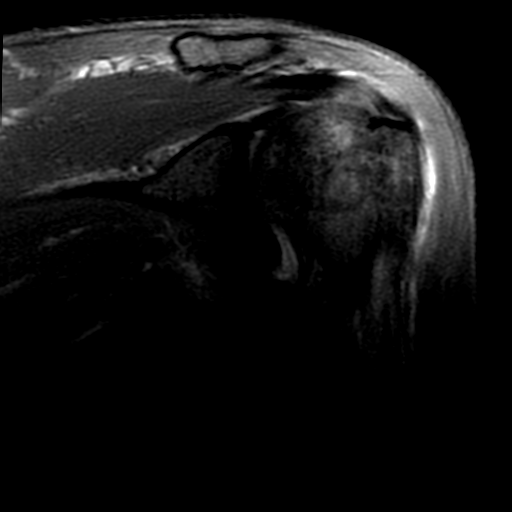
[im 11/18]
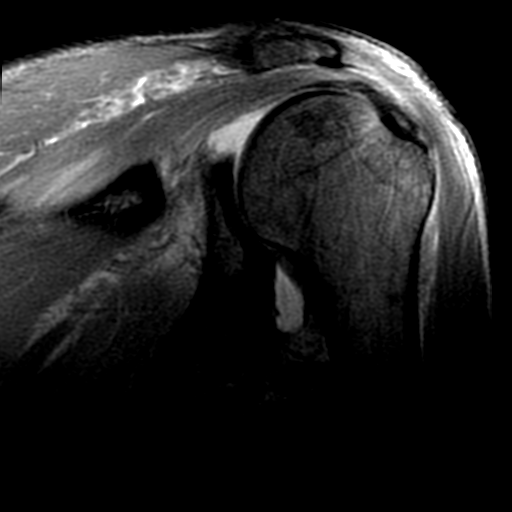
[im 14/18]
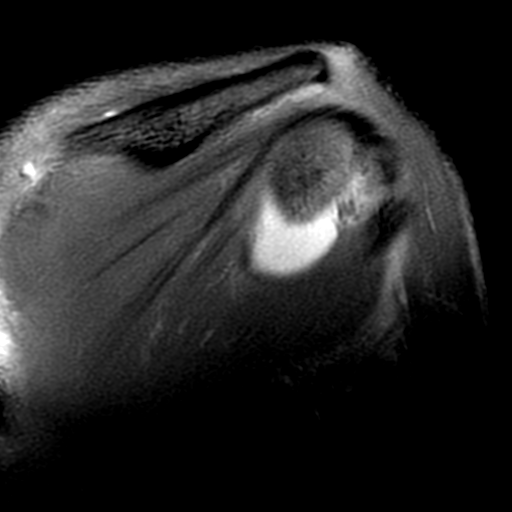
[im 18/18]
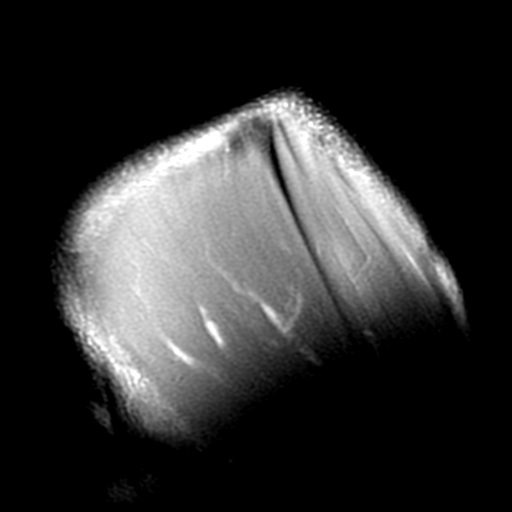

[19 of 40 positions shown; findings below may reference images not displayed]

FINDINGS: Rotator cuff: Intact supraspinatus, infraspinatus, teres minor and
subscapularis tendons.

Muscles: There is edema around the muscles of the rotator cuff, most
prominent around the supraspinatus muscle with some edema within the
posterior proximal fibers of the supraspinatus muscle.

Biceps long head: Properly located. The tendon either has a small
longitudinal split tear or an accessory slip of the tendon at the
level of the bicipital groove.

Acromioclavicular Joint: Normal. Type 2 acromion. There is small
amount of fluid in the subacromial/subdeltoid bursae.

Glenohumeral Joint: Moderate glenohumeral joint effusion. No
chondral defect.

Labrum:  Intact.

Bones: Focal bone contusion of the posterolateral aspect of the
humeral head. Focal edema in the humeral head adjacent to the
superior aspect of the bicipital groove.

Other: None
IMPRESSION: 1. Bone contusions of the humeral head.
2. Probable posttraumatic bursitis and posttraumatic glenohumeral
joint effusion.
3. The labrum appears normal with no findings involving the labrum
or glenoid to suggest transient anterior dislocation.
4. Strain of the proximal supraspinatus muscle.
